# Patient Record
Sex: Male | Born: 1950 | Race: Asian | Hispanic: No | Marital: Married | Smoking: Former smoker
Health system: Southern US, Community
[De-identification: ages and names within clinical notes are randomized; demographics above are authoritative.]

## PROBLEM LIST (undated history)

## (undated) ENCOUNTER — Ambulatory Visit: Disposition: A | Discharge status: Home / Self Care | Payer: Self-pay

## (undated) DIAGNOSIS — I480 Paroxysmal atrial fibrillation: Secondary | ICD-10-CM

## (undated) DIAGNOSIS — J45909 Unspecified asthma, uncomplicated: Secondary | ICD-10-CM

## (undated) DIAGNOSIS — J449 Chronic obstructive pulmonary disease, unspecified: Secondary | ICD-10-CM

## (undated) HISTORY — PX: NO PAST SURGERIES: SHX2092

## (undated) HISTORY — DX: Paroxysmal atrial fibrillation: I48.0

---

## 2012-10-20 ENCOUNTER — Ambulatory Visit (INDEPENDENT_AMBULATORY_CARE_PROVIDER_SITE_OTHER): Payer: PRIVATE HEALTH INSURANCE | Admitting: Emergency Medicine

## 2012-10-20 VITALS — BP 149/88 | HR 68 | Temp 98.4°F | Resp 16 | Ht 69.5 in | Wt 158.0 lb

## 2012-10-20 DIAGNOSIS — L02519 Cutaneous abscess of unspecified hand: Secondary | ICD-10-CM

## 2012-10-20 DIAGNOSIS — L03119 Cellulitis of unspecified part of limb: Secondary | ICD-10-CM

## 2012-10-20 MED ORDER — SULFAMETHOXAZOLE-TRIMETHOPRIM 800-160 MG PO TABS: 1.0000 | ORAL_TABLET | Freq: Two times a day (BID) | ORAL | Status: DC

## 2012-10-20 NOTE — Patient Instructions (Signed)
Cellulitis Cellulitis is an infection of the skin and the tissue beneath it. The infected area is usually red and tender. Cellulitis occurs most often in the arms and lower legs.   CAUSES   Cellulitis is caused by bacteria that enter the skin through cracks or cuts in the skin. The most common types of bacteria that cause cellulitis are Staphylococcus and Streptococcus. SYMPTOMS    Redness and warmth.   Swelling.   Tenderness or pain.   Fever.  DIAGNOSIS  Your caregiver can usually determine what is wrong based on a physical exam. Blood tests may also be done. TREATMENT   Treatment usually involves taking an antibiotic medicine. HOME CARE INSTRUCTIONS    Take your antibiotics as directed. Finish them even if you start to feel better.   Keep the infected arm or leg elevated to reduce swelling.   Apply a warm cloth to the affected area up to 4 times per day to relieve pain.   Only take over-the-counter or prescription medicines for pain, discomfort, or fever as directed by your caregiver.   Keep all follow-up appointments as directed by your caregiver.  SEEK MEDICAL CARE IF:    You notice red streaks coming from the infected area.   Your red area gets larger or turns dark in color.   Your bone or joint underneath the infected area becomes painful after the skin has healed.   Your infection returns in the same area or another area.   You notice a swollen bump in the infected area.   You develop new symptoms.  SEEK IMMEDIATE MEDICAL CARE IF:    You have a fever.   You feel very sleepy.   You develop vomiting or diarrhea.   You have a general ill feeling (malaise) with muscle aches and pains.  MAKE SURE YOU:    Understand these instructions.   Will watch your condition.   Will get help right away if you are not doing well or get worse.  Document Released: 05/26/2005 Document Revised: 02/15/2012 Document Reviewed: 11/01/2011 [employer] Patient Information 2013  [employer]    

## 2012-10-20 NOTE — Progress Notes (Signed)
[facility] [address] [place] [address] [phone]  Date:  10/20/2012   Name:  Ronnie Palmer   DOB:  25-Mar-1951   MRN:  [id number]  PCP:  No primary provider on file.    Chief Complaint: Hand Injury   History of Present Illness:  Ronnie Palmer is a 62 y.o. very pleasant male patient who presents with the following:  No history of injury.  Has sudden swelling and pain in dorsum of right hand over the third MCP joint.  No fever or chills.  No history of gout.  There is no problem list on file for this patient.   History reviewed. No pertinent past medical history.  History reviewed. No pertinent past surgical history.  History  Substance Use Topics  . Smoking status: Current Every Day Smoker -- 1.00 packs/day  . Smokeless tobacco: Not on file  . Alcohol Use: No    History reviewed. No pertinent family history.  No Known Allergies  Medication list has been reviewed and updated.  No current outpatient prescriptions on file prior to visit.   No current facility-administered medications on file prior to visit.    Review of Systems:  As per HPI, otherwise negative.    Physical Examination: Filed Vitals:   10/20/12 1438  BP: 149/88  Pulse: 68  Temp: 98.4 F (36.9 C)  Resp: 16   Filed Vitals:   10/20/12 1438  Height: 5' 9.5" (1.765 m)  Weight: 158 lb (71.668 kg)   Body mass index is 23.01 kg/(m^2). Ideal Body Weight: Weight in (lb) to have BMI = 25: 171.4   GEN: WDWN, NAD, Non-toxic, Alert & Oriented x 3 HEENT: Atraumatic, Normocephalic.  Ears and Nose: No external deformity. EXTR: No clubbing/cyanosis/edema NEURO: Normal gait.  PSYCH: Normally interactive. Conversant. Not depressed or anxious appearing.  Calm demeanor.  HAND:  Right hand swelling and erythema over right 3rd MCP joint and dorsum of hand.  No joint tenderness or crepitus.  No lymphangitis. NATI.  No foreign body.  Assessment and  Plan: Cellulitis hand Septra Local heat Follow up Monday if not improved  Carmelina Dane, MD

## 2014-10-18 ENCOUNTER — Encounter (HOSPITAL_COMMUNITY): Payer: Self-pay | Admitting: Emergency Medicine

## 2014-10-18 ENCOUNTER — Emergency Department (INDEPENDENT_AMBULATORY_CARE_PROVIDER_SITE_OTHER)
Admission: EM | Admit: 2014-10-18 | Discharge: 2014-10-18 | Disposition: A | Discharge status: Home / Self Care | Payer: PRIVATE HEALTH INSURANCE | Source: Home / Self Care | Attending: Family Medicine | Admitting: Family Medicine

## 2014-10-18 DIAGNOSIS — R21 Rash and other nonspecific skin eruption: Secondary | ICD-10-CM | POA: Diagnosis not present

## 2014-10-18 MED ORDER — CLOTRIMAZOLE-BETAMETHASONE 1-0.05 % EX CREA: TOPICAL_CREAM | CUTANEOUS | Status: DC

## 2014-10-18 NOTE — ED Notes (Signed)
Pt states that he has had ear pain and ear itching for awhile now per pt.

## 2014-10-18 NOTE — ED Provider Notes (Signed)
CSN: [id number]     Arrival date & time 10/18/14  1550 History   First MD Initiated Contact with Patient 10/18/14 1641     Chief Complaint  Patient presents with  . Otalgia   (Consider location/radiation/quality/duration/timing/severity/associated sxs/prior Treatment) HPI        64 year old male presents for evaluation of a rash. For the past few months he has a rash on the inside of both of his ears. He gets cracked and irritated. No discharge or drainage. No hearing difficulty. No systemic symptoms.  No past medical history on file. No past surgical history on file. No family history on file. History  Substance Use Topics  . Smoking status: Current Every Day Smoker -- 1.00 packs/day  . Smokeless tobacco: Not on file  . Alcohol Use: No    Review of Systems  Skin: Positive for rash.  All other systems reviewed and are negative.   Allergies  Review of patient's allergies indicates no known allergies.  Home Medications   Prior to Admission medications   Medication Sig Start Date End Date Taking? Authorizing Provider  clotrimazole-betamethasone (LOTRISONE) cream Apply to affected area 3 times daily for 2 weeks 10/18/14   Graylon GoodZachary H Jaquil Todt, PA-C  sulfamethoxazole-trimethoprim (BACTRIM DS,SEPTRA DS) 800-160 MG per tablet Take 1 tablet by mouth 2 (two) times daily. 10/20/12   Carmelina DaneJeffery S Anderson, MD   BP 145/86 mmHg  Pulse 84  Temp(Src) 98.2 F (36.8 C) (Oral)  Resp 16  SpO2 95% Physical Exam  Constitutional: He is oriented to person, place, and time. He appears well-developed and well-nourished. No distress.  HENT:  Head: Normocephalic and atraumatic.  Bilateral external ears at the superior portion of the external auditory canal is crusting, slightly macerated, slightly cracked skin  Pulmonary/Chest: Effort normal. No respiratory distress.  Neurological: He is alert and oriented to person, place, and time. Coordination normal.  Skin: Skin is warm and dry. No rash noted. He is  not diaphoretic.  Psychiatric: He has a normal mood and affect. Judgment normal.  Nursing note and vitals reviewed.   ED Course  Procedures (including critical care time) Labs Review Labs Reviewed - No data to display  Imaging Review No results found.   MDM   1. Rash    Treat with Lotrisone cream. Follow-up when necessary if not improving with Lotrisone  Meds ordered this encounter  Medications  . clotrimazole-betamethasone (LOTRISONE) cream    Sig: Apply to affected area 3 times daily for 2 weeks    Dispense:  45 g    Refill:  2    Order Specific Question:  Supervising Provider    Answer:  Bradd CanaryKINDL, JAMES D [[id number]]       Graylon GoodZachary H Darneshia Demary, PA-C 10/18/14 1657

## 2014-10-18 NOTE — Discharge Instructions (Signed)

## 2016-12-14 ENCOUNTER — Ambulatory Visit (HOSPITAL_COMMUNITY)
Admission: EM | Admit: 2016-12-14 | Discharge: 2016-12-14 | Disposition: A | Discharge status: Home / Self Care | Payer: Commercial Managed Care - PPO | Attending: Internal Medicine | Admitting: Internal Medicine

## 2016-12-14 ENCOUNTER — Ambulatory Visit (INDEPENDENT_AMBULATORY_CARE_PROVIDER_SITE_OTHER): Payer: Commercial Managed Care - PPO

## 2016-12-14 DIAGNOSIS — M7552 Bursitis of left shoulder: Secondary | ICD-10-CM

## 2016-12-14 MED ORDER — CLOTRIMAZOLE-BETAMETHASONE 1-0.05 % EX CREA: TOPICAL_CREAM | CUTANEOUS | 2 refills | Status: DC

## 2016-12-14 MED ORDER — CYCLOBENZAPRINE HCL 10 MG PO TABS: 10.0000 mg | ORAL_TABLET | Freq: Two times a day (BID) | ORAL | 0 refills | Status: DC | PRN

## 2016-12-14 MED ORDER — PREDNISONE 10 MG (21) PO TBPK: ORAL_TABLET | Freq: Every day | ORAL | 0 refills | Status: DC

## 2016-12-14 NOTE — ED Triage Notes (Addendum)
Pain started 1 1/2 months ago.  No known injury.  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  Patient is right handed.    Patient has a cream that he uses for a rash, son is asking for a refill-son says he was seen here for this last time he was here

## 2016-12-14 NOTE — Discharge Instructions (Signed)
You have an impingement in your left shoulder. I prescribed a long course of steroids, prednisone, take 6 tablets today, then decreased by one tablet every other day until finished. Also prescribed a muscle relaxer called Flexeril, take one tablet twice a day. This medicine may cause drowsiness so do not drive or drink alcohol while taking.  I have also refilled your Lotrisone cream as requested.  Should your symptoms persist, or fail to resolve, follow up with an orthopedist or primary care provider.

## 2016-12-14 NOTE — ED Provider Notes (Signed)
CSN: [id number]     Arrival date & time 12/14/16  1413 History   First MD Initiated Contact with Patient 12/14/16 1531     Chief Complaint  Patient presents with  . Shoulder Pain   (Consider location/radiation/quality/duration/timing/severity/associated sxs/prior Treatment) 66 year old male presents to clinic with left shoulder pain. 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   The history is provided by the patient.  Shoulder Pain  Location:  Shoulder Shoulder location:  L shoulder Injury: no   Pain details:    Quality:  Aching, pressure and throbbing   Radiates to:  Does not radiate   Severity:  Moderate   Onset quality:  Gradual   Duration:  1 week   Timing:  Constant   Progression:  Worsening Handedness:  Right-handed Dislocation: no   Prior injury to area:  No Relieved by:  None tried Worsened by:  Movement and stretching area Ineffective treatments:  None tried Associated symptoms: decreased range of motion, stiffness and tingling   Associated symptoms: no back pain, no fatigue, no fever, no muscle weakness, no neck pain, no numbness and no swelling     No past medical history on file. No past surgical history on file. No family history on file. Social History  Substance Use Topics  . Smoking status: Current Every Day Smoker    Packs/day: 0.50  . Smokeless tobacco: Not on file  . Alcohol use Yes     Comment: occasional    Review of Systems  Constitutional: Negative for fatigue and fever.  HENT: Negative.   Respiratory: Negative.   Cardiovascular: Negative.   Gastrointestinal: Negative.   Genitourinary: Negative.   Musculoskeletal: Positive for stiffness. Negative for back pain and neck pain.  Neurological: Negative.     Allergies  Patient has no known allergies.  Home Medications   Prior to Admission medications   Medication Sig Start Date End Date Taking? Authorizing Provider  Naproxen Sodium (ALEVE PO)  Take by mouth.   Yes Historical Provider, MD  clotrimazole-betamethasone (LOTRISONE) cream Apply to affected area 3 times daily for 2 weeks 12/14/16   Dorena Bodo, NP  cyclobenzaprine (FLEXERIL) 10 MG tablet Take 1 tablet (10 mg total) by mouth 2 (two) times daily as needed for muscle spasms. 12/14/16   Dorena Bodo, NP  predniSONE (STERAPRED UNI-PAK 21 TAB) 10 MG (21) TBPK tablet Take by mouth daily. Take 6 tabs by mouth daily  for 2 days, then 5 tabs for 2 days, then 4 tabs for 2 days, then 3 tabs for 2 days, 2 tabs for 2 days, then 1 tab by mouth daily for 2 days 12/14/16   Dorena Bodo, NP   Meds Ordered and Administered this Visit  Medications - No data to display  BP (!) 150/84 (BP Location: Left Arm) Comment: notified rn  Pulse 66   Temp 98.2 F (36.8 C) (Oral)   Resp 14   SpO2 98%  No data found.   Physical Exam  Constitutional: He is oriented to person, place, and time. He appears well-developed and well-nourished. No distress.  HENT:  Head: Normocephalic and atraumatic.  Right Ear: External ear normal.  Left Ear: External ear normal.  Eyes: Conjunctivae are normal.  Cardiovascular: Normal rate and regular rhythm.   Pulmonary/Chest: Effort normal and breath sounds normal.  Musculoskeletal:  Left: Palpable, hard, mass felt at the acromioclavicular joint. Tenderness with pressure at the glenohumeral joint and the coracoid process. Flexion limited to 10, extension limited  to 40, pain with internal and external rotation, pain with abduction.  Neurological: He is alert and oriented to person, place, and time.  Skin: Skin is warm and dry. Capillary refill takes less than 2 seconds. He is not diaphoretic.  Psychiatric: He has a normal mood and affect. His behavior is normal.  Nursing note and vitals reviewed.   Urgent Care Course     Procedures (including critical care time)  Labs Review Labs Reviewed - No data to display  Imaging Review Dg Shoulder  Left  Result Date: 12/14/2016 CLINICAL DATA:  66 y/o  M; hard nodule and shoulder pain. EXAM: LEFT SHOULDER - 2+ VIEW COMPARISON:  None. FINDINGS: There is no evidence of fracture or dislocation. There is no evidence of arthropathy or other focal bone abnormality. Soft tissues are unremarkable. IMPRESSION: Negative. Electronically Signed   By: Mitzi Hansen M.D.   On: 12/14/2016 15:53      MDM   1. Bursitis of left shoulder    Treating for bursitis. Given long course of prednisone and flexeril. Recommend following up with ortho if symptoms persist past two weeks.     Dorena Bodo, NP 12/14/16 1704

## 2017-03-11 ENCOUNTER — Encounter (HOSPITAL_COMMUNITY): Payer: Self-pay | Admitting: *Deleted

## 2017-03-11 ENCOUNTER — Ambulatory Visit (HOSPITAL_COMMUNITY)
Admission: EM | Admit: 2017-03-11 | Discharge: 2017-03-11 | Disposition: A | Discharge status: Home / Self Care | Payer: Commercial Managed Care - PPO | Attending: Internal Medicine | Admitting: Internal Medicine

## 2017-03-11 DIAGNOSIS — R21 Rash and other nonspecific skin eruption: Secondary | ICD-10-CM

## 2017-03-11 DIAGNOSIS — M25551 Pain in right hip: Secondary | ICD-10-CM

## 2017-03-11 DIAGNOSIS — M5431 Sciatica, right side: Secondary | ICD-10-CM | POA: Diagnosis not present

## 2017-03-11 MED ORDER — CYCLOBENZAPRINE HCL 10 MG PO TABS: 10.0000 mg | ORAL_TABLET | Freq: Every day | ORAL | 0 refills | Status: DC

## 2017-03-11 MED ORDER — METHYLPREDNISOLONE SODIUM SUCC 125 MG IJ SOLR
125.0000 mg | Freq: Once | INTRAMUSCULAR | Status: AC
  Administered 2017-03-11: 125 mg via INTRAMUSCULAR

## 2017-03-11 MED ORDER — METHYLPREDNISOLONE SODIUM SUCC 125 MG IJ SOLR
INTRAMUSCULAR | Status: AC
  Filled 2017-03-11: qty 2

## 2017-03-11 MED ORDER — DICLOFENAC SODIUM 75 MG PO TBEC: 75.0000 mg | DELAYED_RELEASE_TABLET | Freq: Two times a day (BID) | ORAL | 0 refills | Status: DC | PRN

## 2017-03-11 NOTE — ED Triage Notes (Signed)
Pt  Also  Reports  Dryness  And  Rash  On  Bottom  Of  r  Foot

## 2017-03-11 NOTE — ED Triage Notes (Signed)
Pt  Reports  Pain   r  Hip    Pain  Radiates      Down  r  Leg     Pt  Reports      Had  Similar  Symptoms   On the  l  Shoulder       sev  Weeks  Ago  According to family  Member  The pt  Denies  Any  specefic  Injury  He  Reports  Pain on   Weight  Bearing     He  Reports  He  Has  Been using otc  meds  And  Cream  Topically

## 2017-03-11 NOTE — ED Provider Notes (Signed)
CSN: [id number]     Arrival date & time 03/11/17  1336 History   First MD Initiated Contact with Patient 03/11/17 1541     Chief Complaint  Patient presents with  . Hip Pain   (Consider location/radiation/quality/duration/timing/severity/associated sxs/prior Treatment) 66 year old male presents with right sided hip/upper buttock pain that radiates down his entire leg to his foot. Started about 4 days ago. No distinct trauma or injury. Worse at night. Also noticed rash on his right top of his foot 2 days ago- does not itch but slightly tender. Denies any fever, abdominal pain, nausea, vomiting, diarrhea, constipation, or urinary symptoms. He was seen here a few months ago for bursitis on his left shoulder which resolved with Prednisone taper. Otherwise, no chronic health issues. Does smoke tobacco daily. Takes no daily medication other than Lotrisone for occasional rash/irritation of external ear.     The history is provided by the patient and a caregiver.    History reviewed. No pertinent past medical history. History reviewed. No pertinent surgical history. History reviewed. No pertinent family history. Social History  Substance Use Topics  . Smoking status: Current Every Day Smoker    Packs/day: 0.50  . Smokeless tobacco: Not on file  . Alcohol use Yes     Comment: occasional    Review of Systems  Constitutional: Negative for activity change, appetite change, chills, diaphoresis, fatigue, fever and unexpected weight change.  Respiratory: Negative for cough, chest tightness, shortness of breath and wheezing.   Cardiovascular: Negative for chest pain, palpitations and leg swelling.  Gastrointestinal: Negative for abdominal pain, blood in stool, constipation, diarrhea, nausea and vomiting.  Genitourinary: Negative for decreased urine volume, difficulty urinating, dysuria, flank pain and hematuria.  Musculoskeletal: Positive for arthralgias, back pain and myalgias. Negative for gait  problem, joint swelling, neck pain and neck stiffness.  Skin: Positive for rash. Negative for wound.  Allergic/Immunologic: Negative for immunocompromised state.  Neurological: Negative for dizziness, tremors, seizures, syncope, weakness, light-headedness, numbness and headaches.  Hematological: Negative for adenopathy. Does not bruise/bleed easily.    Allergies  Patient has no known allergies.  Home Medications   Prior to Admission medications   Medication Sig Start Date End Date Taking? Authorizing Provider  clotrimazole-betamethasone (LOTRISONE) cream Apply to affected area 3 times daily for 2 weeks 12/14/16   Dorena Bodo, NP  cyclobenzaprine (FLEXERIL) 10 MG tablet Take 1 tablet (10 mg total) by mouth at bedtime. As needed for muscle spasms/pain. 03/11/17   Sudie Grumbling, NP  diclofenac (VOLTAREN) 75 MG EC tablet Take 1 tablet (75 mg total) by mouth 2 (two) times daily as needed for moderate pain. 03/11/17   Sudie Grumbling, NP   Meds Ordered and Administered this Visit   Medications  methylPREDNISolone sodium succinate (SOLU-MEDROL) 125 mg/2 mL injection 125 mg (125 mg Intramuscular Given 03/11/17 1606)    BP 130/74 (BP Location: Right Arm)   Pulse 78   Temp 98.6 F (37 C) (Oral)   Resp 18   SpO2 100%  No data found.   Physical Exam  Constitutional: He is oriented to person, place, and time. He appears well-developed and well-nourished. He is cooperative. No distress.  HENT:  Head: Normocephalic and atraumatic.  Right Ear: External ear normal.  Left Ear: External ear normal.  Nose: Nose normal.  Eyes: Pupils are equal, round, and reactive to light. Conjunctivae and EOM are normal.  Neck: Normal range of motion. Neck supple.  Cardiovascular: Normal rate, regular rhythm and normal  heart sounds.   No murmur heard. Pulmonary/Chest: Effort normal and breath sounds normal. No respiratory distress. He has no wheezes.  Abdominal: Soft. Normal appearance and bowel  sounds are normal. He exhibits no distension and no mass. There is no tenderness. There is no guarding and no CVA tenderness.  Musculoskeletal: Normal range of motion. He exhibits tenderness.       Right hip: He exhibits tenderness. He exhibits normal range of motion, normal strength, no swelling, no crepitus, no deformity and no laceration.       Lumbar back: He exhibits tenderness and pain. He exhibits normal range of motion, no swelling, no edema, no deformity and no spasm.       Legs:      Feet:  Tender on right side lower lumbar area/upper buttock with slight tenderness above right hip. SLR= + on right side. No pain with flexion of right hip joint. No distinct muscle spasm detected. Good distal pulses. No neuro deficits noted.    Lymphadenopathy:    He has no cervical adenopathy.  Neurological: He is alert and oriented to person, place, and time. He has normal strength and normal reflexes. No sensory deficit. Coordination and gait normal.  Skin: Skin is warm, dry and intact. Capillary refill takes less than 2 seconds. Rash noted. Rash is papular.  Pink papular lesions present on right foot only. Mainly along medial aspect of arch and dorsal aspect of foot. No lesions on the sole. No surrounding erythema. Slightly tender but no fluid or ulcers. No crusting or discharge.   Psychiatric: He has a normal mood and affect. His behavior is normal.    Urgent Care Course     Procedures (including critical care time)  Labs Review Labs Reviewed - No data to display  Imaging Review No results found.   Visual Acuity Review  Right Eye Distance:   Left Eye Distance:   Bilateral Distance:    Right Eye Near:   Left Eye Near:    Bilateral Near:         MDM   1. Sciatica of right side   2. Rash and nonspecific skin eruption    Discussed with patient and caregiver that he appears to have sciatica on the right side. Also reviewed that I am uncertain the cause of his rash on his foot-  appears like a contact dermatitis but does not itch and is tender. Does not appear to be shingles. Does not appear fungal. Will trial Solu-Medrol 125mg  given IM today to help with back pain as well as rash. May start Voltaren 75mg  twice a day as needed for pain. May also take Flexeril 10mg  at bedtime as needed. Note written for work. Continue to monitor pain and symptoms. If symptoms do not improve within 3 to 4 days, call the Orthopedic (information provided) for further evaluation.      Sudie GrumblingAmyot, Gurjit Loconte Berry, NP 03/11/17 2141

## 2017-03-11 NOTE — Discharge Instructions (Addendum)
You were given a steroid shot today to help with hip pain and to help with your rash. Recommend start Voltaren 75mg  twice a day as needed for pain. May also take Flexeril 10mg  muscle relaxer 1 tablet at bedtime as needed. Continue to monitor pain and symptoms. If symptoms do not improve within 3 to 4 days, call the Orthopedic for further evaluation.

## 2017-04-22 ENCOUNTER — Ambulatory Visit (HOSPITAL_COMMUNITY)
Admission: EM | Admit: 2017-04-22 | Discharge: 2017-04-22 | Disposition: A | Discharge status: Home / Self Care | Payer: Commercial Managed Care - PPO | Attending: Family | Admitting: Family

## 2017-04-22 ENCOUNTER — Encounter (HOSPITAL_COMMUNITY): Payer: Self-pay | Admitting: *Deleted

## 2017-04-22 DIAGNOSIS — L0232 Furuncle of buttock: Secondary | ICD-10-CM | POA: Insufficient documentation

## 2017-04-22 DIAGNOSIS — M549 Dorsalgia, unspecified: Secondary | ICD-10-CM | POA: Diagnosis present

## 2017-04-22 DIAGNOSIS — F17210 Nicotine dependence, cigarettes, uncomplicated: Secondary | ICD-10-CM | POA: Diagnosis not present

## 2017-04-22 DIAGNOSIS — Z79899 Other long term (current) drug therapy: Secondary | ICD-10-CM | POA: Insufficient documentation

## 2017-04-22 MED ORDER — DOXYCYCLINE HYCLATE 100 MG PO TBEC: 100.0000 mg | DELAYED_RELEASE_TABLET | Freq: Two times a day (BID) | ORAL | 0 refills | Status: DC

## 2017-04-22 NOTE — Discharge Instructions (Signed)
Abscess proximal to your anus. Please start oral antibiotics immediately. As discussed, please leave wick in place and come back for wound recheck in 2 days to ensure that it is improving and antibiotics are working. Pending wound culture. If any worsening symptoms including tenderness, swelling, fever, nausea or generally not feeling well, please present to emergency room.

## 2017-04-22 NOTE — ED Provider Notes (Signed)
[facility]    CSN: [id number] Arrival date & time: 04/22/17  1350     History   Chief Complaint Chief Complaint  Patient presents with  . Back Pain    HPI Ronnie Palmer is a 66 y.o. male.   CC: Pain and swelling around rectum. He has seen bright red blood. 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 Cannot sit on bottom due to pain.   NO back pain, abdominal pain, fever, N, V, constipation or straining.    No PCP.      No h/o ckd.  History reviewed. No pertinent past medical history.  There are no active problems to display for this patient.   History reviewed. No pertinent surgical history.     Home Medications    Prior to Admission medications   Medication Sig Start Date End Date Taking? Authorizing Provider  clotrimazole-betamethasone (LOTRISONE) cream Apply to affected area 3 times daily for 2 weeks 12/14/16   Dorena Bodo, NP  cyclobenzaprine (FLEXERIL) 10 MG tablet Take 1 tablet (10 mg total) by mouth at bedtime. As needed for muscle spasms/pain. 03/11/17   Sudie Grumbling, NP  diclofenac (VOLTAREN) 75 MG EC tablet Take 1 tablet (75 mg total) by mouth 2 (two) times daily as needed for moderate pain. 03/11/17   Sudie Grumbling, NP  doxycycline (DORYX) 100 MG EC tablet Take 1 tablet (100 mg total) by mouth 2 (two) times daily. 04/22/17   Allegra Grana, FNP    Family History History reviewed. No pertinent family history.  Social History Social History  Substance Use Topics  . Smoking status: Current Every Day Smoker    Packs/day: 0.50  . Smokeless tobacco: Never Used  . Alcohol use Yes     Comment: occasional     Allergies   Patient has no known allergies.   Review of Systems Review of Systems   Physical Exam Triage Vital Signs ED Triage Vitals  Enc Vitals Group     BP 04/22/17 1439 (!) 155/95     Pulse Rate 04/22/17 1439 69     Resp 04/22/17 1439 16     Temp 04/22/17 1439 97.9 F  (36.6 C)     Temp src --      SpO2 04/22/17 1439 100 %     Weight --      Height --      Head Circumference --      Peak Flow --      Pain Score 04/22/17 1438 10     Pain Loc --      Pain Edu? --      Excl. in GC? --    No data found.   Updated Vital Signs BP (!) 155/95 (BP Location: Right Arm)   Pulse 69   Temp 97.9 F (36.6 C)   Resp 16   SpO2 100%   Visual Acuity Right Eye Distance:   Left Eye Distance:   Bilateral Distance:    Right Eye Near:   Left Eye Near:    Bilateral Near:     Physical Exam  Constitutional: He appears well-developed and well-nourished.  Cardiovascular: Regular rhythm and normal heart sounds.   Pulmonary/Chest: Effort normal and breath sounds normal. No respiratory distress. He has no wheezes. He has no rhonchi. He has no rales.  Abdominal: Soft. Normal appearance and bowel sounds are normal. He exhibits no distension, no fluid wave and no mass. There is no tenderness.  There is no rigidity, no rebound and no guarding.  Genitourinary: Rectal exam shows no external hemorrhoid and no internal hemorrhoid.     Genitourinary Comments: Fluctuant abscess approximately 3 cm in diameter proximal to anus on diagram as noted on diagram. No hemorrhoids visualized.  No surrounding erythema, streaking.   No swelling, abscess, cyst noted over midline skin pits or pilonidal sinus opening.  Lymphadenopathy:       Head (left side): No submandibular and no preauricular adenopathy present.  Neurological: He is alert.  Skin: Skin is warm and dry.  Psychiatric: He has a normal mood and affect. His speech is normal and behavior is normal.  Vitals reviewed.   Patient does not have previous history of MRSA.  Patient does not have diabetes.   The patient gave informed consent for the procedure. Patient and I discussed risks, benefits, and alternatives to I & D.  Including risk of infection from laceration, localized pain, and bleeding. We discussed the option  for oral antibiotics. Patient verbalized understanding to conversation and all questions were answered. We jointly decided to proceed with procedure.  On exam, there is a 3 cm indurated abscess with visually purulent discharge under skin.  It is tender to palpation.  There is erythema with no discharge.  The patient gave informed consent for the procedure. The area was prepped with antiseptic solution. The area was anesthetized using lidocaine with epi. The patient tolerated the anesthetic well.    #11 blade was used to incise the abscess.  Abundant purulent bloody drainage was noted.  Bleeding from the wound was controlled by applying direct pressure with gauze.   Iodoform was used to pack the abscess.  A bandage was placed.  Wound culture was obtained.  The patient tolerated the procedure well.  Extensive instructions were given to the patient.   Complications: None  Take antibiotic as directed.  Follow-up for wound check in 2-3 days.   UC Treatments / Results  Labs (all labs ordered are listed, but only abnormal results are displayed) Labs Reviewed  AEROBIC CULTURE (SUPERFICIAL SPECIMEN)    EKG  EKG Interpretation None       Radiology No results found.  Procedures Procedures (including critical care time)  Medications Ordered in UC Medications - No data to display   Initial Impression / Assessment and Plan / UC Course  I have reviewed the triage vital signs and the nursing notes.  Pertinent labs & imaging results that were available during my care of the patient were reviewed by me and considered in my medical decision making (see chart for details).       Final Clinical Impressions(s) / UC Diagnoses   Final diagnoses:  Boil of buttock  Presentation supports infected fluctuant abscess. Based on location, , it is not over the pilonidal sinus opening. Patient tolerated incision and drainage well. We'll start doxycycline today. We'll recheck in 2 days.  Return precautions given to patient  New Prescriptions New Prescriptions   DOXYCYCLINE (DORYX) 100 MG EC TABLET    Take 1 tablet (100 mg total) by mouth 2 (two) times daily.     Controlled Substance Prescriptions [place] Controlled Substance Registry consulted? Not Applicable   Allegra Grana, FNP 04/22/17 1615

## 2017-04-22 NOTE — ED Triage Notes (Signed)
Patient reports discomfort around rectum.

## 2017-04-24 ENCOUNTER — Ambulatory Visit (HOSPITAL_COMMUNITY)
Admission: EM | Admit: 2017-04-24 | Discharge: 2017-04-24 | Disposition: A | Discharge status: Home / Self Care | Payer: Commercial Managed Care - PPO

## 2017-04-24 ENCOUNTER — Encounter (HOSPITAL_COMMUNITY): Payer: Self-pay | Admitting: Emergency Medicine

## 2017-04-24 DIAGNOSIS — L0232 Furuncle of buttock: Secondary | ICD-10-CM

## 2017-04-24 DIAGNOSIS — Z5189 Encounter for other specified aftercare: Secondary | ICD-10-CM

## 2017-04-24 LAB — AEROBIC CULTURE W GRAM STAIN (SUPERFICIAL SPECIMEN)

## 2017-04-24 LAB — AEROBIC CULTURE  (SUPERFICIAL SPECIMEN)

## 2017-04-24 NOTE — Discharge Instructions (Signed)
Your wound is healing well. Does not need any packing material in the wound. Keep covered for next 24 hours, then may take off gauze and shower with soap and water. Continue to monitor area for any redness, increased pain or swelling- if these occur, follow-up for recheck. Otherwise continue Doxycycline twice a day as directed. Schedule an appointment with a PCP as soon as possible to establish care with a PCP.

## 2017-04-24 NOTE — ED Triage Notes (Signed)
The patient presented to the [facility] for a follow up on a rectal abscess that was drained 04/22/2017.

## 2017-04-25 NOTE — ED Provider Notes (Signed)
[facility]    CSN: [id number] Arrival date & time: 04/24/17  1204     History   Chief Complaint Chief Complaint  Patient presents with  . Abscess    HPI Ronnie Palmer is a 66 y.o. male.   66 year old male presents for recheck on rectal abscess that was I & D here 2 days ago. Was packed with Iodoform and started on Doxycycline twice a day. Area is healing well and packing fell out last night. No side effects from antibiotic use. No fever and mild pain. Son interpreting for patient and also requests name of a PCP to establish care for patient.    The history is provided by the patient and a relative. The history is limited by a language barrier. A language interpreter was used (son).    History reviewed. No pertinent past medical history.  There are no active problems to display for this patient.   History reviewed. No pertinent surgical history.     Home Medications    Prior to Admission medications   Medication Sig Start Date End Date Taking? Authorizing Provider  clotrimazole-betamethasone (LOTRISONE) cream Apply to affected area 3 times daily for 2 weeks 12/14/16   Dorena Bodo, NP  doxycycline (DORYX) 100 MG EC tablet Take 1 tablet (100 mg total) by mouth 2 (two) times daily. 04/22/17   Allegra Grana, FNP    Family History History reviewed. No pertinent family history.  Social History Social History  Substance Use Topics  . Smoking status: Current Every Day Smoker    Packs/day: 0.50  . Smokeless tobacco: Never Used  . Alcohol use Yes     Comment: occasional     Allergies   Patient has no known allergies.   Review of Systems Review of Systems  Constitutional: Negative for activity change, appetite change, chills, fatigue and fever.  Gastrointestinal: Positive for rectal pain. Negative for abdominal pain, anal bleeding, blood in stool, diarrhea, nausea and vomiting.  Genitourinary: Negative for decreased urine volume,  difficulty urinating and dysuria.  Skin: Positive for wound.  Neurological: Negative for weakness, light-headedness, numbness and headaches.     Physical Exam Triage Vital Signs ED Triage Vitals [04/24/17 1229]  Enc Vitals Group     BP 140/78     Pulse Rate 62     Resp 16     Temp 98.3 F (36.8 C)     Temp Source Oral     SpO2 97 %     Weight      Height      Head Circumference      Peak Flow      Pain Score      Pain Loc      Pain Edu?      Excl. in GC?    No data found.   Updated Vital Signs BP 140/78 (BP Location: Right Arm)   Pulse 62   Temp 98.3 F (36.8 C) (Oral)   Resp 16   SpO2 97%   Visual Acuity Right Eye Distance:   Left Eye Distance:   Bilateral Distance:    Right Eye Near:   Left Eye Near:    Bilateral Near:     Physical Exam  Constitutional: He is oriented to person, place, and time. He appears well-developed and well-nourished. No distress.  HENT:  Head: Normocephalic and atraumatic.  Genitourinary: Rectal exam shows tenderness.     Genitourinary Comments: Abscess about 1cm on right distal aspect of anus.  Still open but minimal drainage. Mild tenderness. No surrounding erythema. Healing well.   Musculoskeletal: Normal range of motion.  Neurological: He is alert and oriented to person, place, and time.  Skin: Skin is warm and dry.  Psychiatric: He has a normal mood and affect. His behavior is normal.     UC Treatments / Results  Labs (all labs ordered are listed, but only abnormal results are displayed) Labs Reviewed - No data to display  EKG  EKG Interpretation None       Radiology No results found.  Procedures Procedures (including critical care time)  Medications Ordered in UC Medications - No data to display   Initial Impression / Assessment and Plan / UC Course  I have reviewed the triage vital signs and the nursing notes.  Pertinent labs & imaging results that were available during my care of the patient were  reviewed by me and considered in my medical decision making (see chart for details).    Discussed with son and patient that area is healing well. No packing needed. Applied bacitracin and covered with gauze. May remove gauze tomorrow and shower as usual. Keep clean and dry. Continue Doxycycline 100mg  twice a day as directed. If any increased pain, swelling or redness occur, follow up for recheck. Otherwise schedule an appointment with a PCP (contact information listed) as soon as possible to establish care.   Final Clinical Impressions(s) / UC Diagnoses   Final diagnoses:  Wound check, abscess    New Prescriptions Discharge Medication List as of 04/24/2017  1:27 PM       Controlled Substance Prescriptions [place] Controlled Substance Registry consulted? Not Applicable   Sudie Grumbling, NP 04/25/17 [phone]

## 2017-04-27 ENCOUNTER — Telehealth (HOSPITAL_COMMUNITY): Payer: Self-pay | Admitting: Emergency Medicine

## 2017-04-27 MED ORDER — CEPHALEXIN 500 MG PO CAPS: 500.0000 mg | ORAL_CAPSULE | Freq: Two times a day (BID) | ORAL | 0 refills | Status: DC

## 2017-04-27 NOTE — Telephone Encounter (Signed)
-----   Message from Eustace Moore, MD sent at 04/24/2017  6:24 PM EDT ----- Clinical staff please let patient know that wound culture was positive for rare Group B Strep and Haemophilus germs.   If pain/swelling/drainage from abscess are decreasing, would not change antibiotics at this time. If pain/swelling/drainage are not improving, would send rx for cephalexin 500mg  bid x 7d #14 no refills, and patient should also finish rx for doxycycline.   Patient should recheck or go to ED for increasing pain/swelling/drainage at abscess site or new fever >100.5.  LM

## 2017-04-27 NOTE — Telephone Encounter (Signed)
Son returned my call... Was able to ID pt properly... Was w/pt during visit.  Reports feeling better and sx are subsiding but still having some pain.  Pt is taking/tolarating well meds given at visit.  Per his request... Called in keflex to [facility].)  Adv pt if sx are not getting better to return or to f/u w/PCP Notified pt that lab results can be obtained through MyChart Pt verb understanding.

## 2018-03-22 ENCOUNTER — Other Ambulatory Visit: Payer: Self-pay

## 2018-03-22 ENCOUNTER — Encounter (HOSPITAL_COMMUNITY): Payer: Self-pay | Admitting: Emergency Medicine

## 2018-03-22 ENCOUNTER — Ambulatory Visit (HOSPITAL_COMMUNITY)
Admission: EM | Admit: 2018-03-22 | Discharge: 2018-03-22 | Disposition: A | Discharge status: Home / Self Care | Payer: Commercial Managed Care - PPO | Attending: Family Medicine | Admitting: Family Medicine

## 2018-03-22 DIAGNOSIS — R21 Rash and other nonspecific skin eruption: Secondary | ICD-10-CM | POA: Diagnosis not present

## 2018-03-22 MED ORDER — TRIAMCINOLONE ACETONIDE 0.1 % EX CREA: 1.0000 "application " | TOPICAL_CREAM | Freq: Two times a day (BID) | CUTANEOUS | 0 refills | Status: DC

## 2018-03-22 MED ORDER — CLOTRIMAZOLE-BETAMETHASONE 1-0.05 % EX CREA: TOPICAL_CREAM | CUTANEOUS | 2 refills | Status: DC

## 2018-03-22 NOTE — Discharge Instructions (Addendum)
As discussed, symptoms could be due to using cotton swabs, and drying out the ear canal. Apply triamcinolone cream to affected area for itching. Keep area clean and dry. Avoid rubbing at the area, avoid cotton swab use. You can take over the counter antihistamine such as benadryl, zyrtec to help with itching. Monitor for spreading redness, increase warmth, swelling, increase pain, follow up for reevaluation needed.   As discussed, in the future, you can use over the counter hydrocortisone cream to see if it helps with symptoms. Lotrisone also has a fungal cream content, if you would like to cover for fungal infection, you can try over the counter lamisil as directed.

## 2018-03-22 NOTE — ED Provider Notes (Signed)
[facility]    CSN: [id number] Arrival date & time: 03/22/18  1503     History   Chief Complaint Chief Complaint  Patient presents with  . Rash    external ears bilateral    HPI Ronnie Palmer is a 67 y.o. male.   67 year old male comes in with son for recurrent itching and pain to the external ears. States in the past used lotrisone with resolution of symptoms. Currently out of lotrisone and son wonders if there is an otc medicine that they can buy. Patient does use cotton swab to the region, especially for itching, which then causes pain. Denies spreading redness, increased warmth, fever. Denies pain or itching, rash to other locations. Denies ear pain, ear canal swelling, decrease in hearing.      History reviewed. No pertinent past medical history.  There are no active problems to display for this patient.   History reviewed. No pertinent surgical history.     Home Medications    Prior to Admission medications   Medication Sig Start Date End Date Taking? Authorizing Provider  clotrimazole-betamethasone (LOTRISONE) cream Apply to affected area 3 times daily for 2 weeks 03/22/18   Cathie Hoops, Amy V, PA-C  triamcinolone cream (KENALOG) 0.1 % Apply 1 application topically 2 (two) times daily. 03/22/18   Belinda Fisher, PA-C    Family History History reviewed. No pertinent family history.  Social History Social History   Tobacco Use  . Smoking status: Current Every Day Smoker    Packs/day: 0.50  . Smokeless tobacco: Never Used  Substance Use Topics  . Alcohol use: Yes    Comment: occasional  . Drug use: No     Allergies   Patient has no known allergies.   Review of Systems Review of Systems  Reason unable to perform ROS: See HPI as above.     Physical Exam Triage Vital Signs ED Triage Vitals  Enc Vitals Group     BP 03/22/18 1516 (!) 161/80     Pulse Rate 03/22/18 1516 86     Resp 03/22/18 1516 16     Temp 03/22/18 1516 98 F (36.7 C)    Temp Source 03/22/18 1516 Oral     SpO2 03/22/18 1516 97 %     Weight --      Height --      Head Circumference --      Peak Flow --      Pain Score 03/22/18 1523 0     Pain Loc --      Pain Edu? --      Excl. in GC? --    No data found.  Updated Vital Signs BP (!) 161/80 (BP Location: Left Arm)   Pulse 86   Temp 98 F (36.7 C) (Oral)   Resp 16   SpO2 97%   Physical Exam  Constitutional: He is oriented to person, place, and time. He appears well-developed and well-nourished. No distress.  HENT:  Head: Normocephalic and atraumatic.  Dry cracking skin to the 12 oclock region of the external ear canal bilaterally. No bleeding. No erythema, increased warmth. No swelling of the ear canal. Without similar appearance within the ear canal. TM normal without erythema or bulging bilaterally.   Eyes: Pupils are equal, round, and reactive to light. Conjunctivae are normal.  Neurological: He is alert and oriented to person, place, and time.  Skin: He is not diaphoretic.    UC Treatments / Results  Labs (all  labs ordered are listed, but only abnormal results are displayed) Labs Reviewed - No data to display  EKG None  Radiology No results found.  Procedures Procedures (including critical care time)  Medications Ordered in UC Medications - No data to display  Initial Impression / Assessment and Plan / UC Course  I have reviewed the triage vital signs and the nursing notes.  Pertinent labs & imaging results that were available during my care of the patient were reviewed by me and considered in my medical decision making (see chart for details).    Discussed with patient and family member, symptoms could be caused by use of cotton swab. Will start triamcinolone cream as no indication of fungal infection on exam today. Rx of lotrisone given to patient, can fill if no relief on triamcinolone. Discussed over the counter medicine such as Lamisil with hydrocortisone cream to help  with symptoms. Will have patient avoid using cotton swab to see if it will prevent further flare up of symptoms. Return precautions given. Patient and family member expresses understanding and agrees to plan.  Final Clinical Impressions(s) / UC Diagnoses   Final diagnoses:  Rash    ED Prescriptions    Medication Sig Dispense Auth. Provider   triamcinolone cream (KENALOG) 0.1 % Apply 1 application topically 2 (two) times daily. 30 g Yu, Amy V, PA-C   clotrimazole-betamethasone (LOTRISONE) cream Apply to affected area 3 times daily for 2 weeks 45 g Threasa AlphaYu, Amy V, PA-C        Yu, Amy V, [place] 03/22/18 1558

## 2018-03-22 NOTE — ED Triage Notes (Signed)
Pt has a recurrent issue with pain/itching to his external ears.  He has been prescribed Lotrisone in the past and they state it works well.  He will get a flare up, use the cream and it will go away for a while.    Family is wanting to know if there is an equivalent OTC medication.

## 2018-11-25 ENCOUNTER — Emergency Department (HOSPITAL_COMMUNITY): Payer: Commercial Managed Care - PPO

## 2018-11-25 ENCOUNTER — Ambulatory Visit (HOSPITAL_COMMUNITY)
Admission: EM | Admit: 2018-11-25 | Discharge: 2018-11-25 | Disposition: A | Discharge status: Home / Self Care | Payer: Commercial Managed Care - PPO | Source: Home / Self Care | Attending: Family Medicine | Admitting: Family Medicine

## 2018-11-25 ENCOUNTER — Encounter (HOSPITAL_COMMUNITY): Payer: Self-pay

## 2018-11-25 ENCOUNTER — Other Ambulatory Visit: Payer: Self-pay

## 2018-11-25 ENCOUNTER — Emergency Department (HOSPITAL_COMMUNITY)
Admission: EM | Admit: 2018-11-25 | Discharge: 2018-11-25 | Disposition: A | Discharge status: Home / Self Care | Payer: Commercial Managed Care - PPO | Attending: Emergency Medicine | Admitting: Emergency Medicine

## 2018-11-25 DIAGNOSIS — R221 Localized swelling, mass and lump, neck: Secondary | ICD-10-CM | POA: Insufficient documentation

## 2018-11-25 DIAGNOSIS — F17210 Nicotine dependence, cigarettes, uncomplicated: Secondary | ICD-10-CM | POA: Insufficient documentation

## 2018-11-25 DIAGNOSIS — J029 Acute pharyngitis, unspecified: Secondary | ICD-10-CM | POA: Diagnosis present

## 2018-11-25 DIAGNOSIS — J36 Peritonsillar abscess: Secondary | ICD-10-CM | POA: Insufficient documentation

## 2018-11-25 DIAGNOSIS — Z79899 Other long term (current) drug therapy: Secondary | ICD-10-CM | POA: Insufficient documentation

## 2018-11-25 LAB — CBC WITH DIFFERENTIAL/PLATELET
Abs Immature Granulocytes: 0.09 10*3/uL — ABNORMAL HIGH (ref 0.00–0.07)
Basophils Absolute: 0.1 10*3/uL (ref 0.0–0.1)
Basophils Relative: 0 %
EOS PCT: 2 %
Eosinophils Absolute: 0.3 10*3/uL (ref 0.0–0.5)
HEMATOCRIT: 43.2 % (ref 39.0–52.0)
HEMOGLOBIN: 13.4 g/dL (ref 13.0–17.0)
IMMATURE GRANULOCYTES: 1 %
LYMPHS ABS: 2 10*3/uL (ref 0.7–4.0)
LYMPHS PCT: 12 %
MCH: 25.9 pg — ABNORMAL LOW (ref 26.0–34.0)
MCHC: 31 g/dL (ref 30.0–36.0)
MCV: 83.4 fL (ref 80.0–100.0)
Monocytes Absolute: 1.5 10*3/uL — ABNORMAL HIGH (ref 0.1–1.0)
Monocytes Relative: 9 %
Neutro Abs: 13.1 10*3/uL — ABNORMAL HIGH (ref 1.7–7.7)
Neutrophils Relative %: 76 %
Platelets: 268 10*3/uL (ref 150–400)
RBC: 5.18 MIL/uL (ref 4.22–5.81)
RDW: 12.7 % (ref 11.5–15.5)
WBC: 17.1 10*3/uL — ABNORMAL HIGH (ref 4.0–10.5)
nRBC: 0 % (ref 0.0–0.2)

## 2018-11-25 LAB — POCT RAPID STREP A: Streptococcus, Group A Screen (Direct): NEGATIVE

## 2018-11-25 LAB — BASIC METABOLIC PANEL
ANION GAP: 13 (ref 5–15)
BUN: 13 mg/dL (ref 8–23)
CHLORIDE: 100 mmol/L (ref 98–111)
CO2: 20 mmol/L — AB (ref 22–32)
Calcium: 8.7 mg/dL — ABNORMAL LOW (ref 8.9–10.3)
Creatinine, Ser: 0.83 mg/dL (ref 0.61–1.24)
GFR calc Af Amer: >60 mL/min (ref >60)
GFR calc non Af Amer: >60 mL/min (ref >60)
Glucose, Bld: 89 mg/dL (ref 70–99)
Potassium: 3.8 mmol/L (ref 3.5–5.1)
Sodium: 133 mmol/L — ABNORMAL LOW (ref 135–145)

## 2018-11-25 MED ORDER — IOHEXOL 300 MG/ML  SOLN
100.0000 mL | Freq: Once | INTRAMUSCULAR | Status: AC | PRN
  Administered 2018-11-25: 75 mL via INTRAVENOUS

## 2018-11-25 MED ORDER — IBUPROFEN 800 MG PO TABS: 800.0000 mg | ORAL_TABLET | Freq: Three times a day (TID) | ORAL | 0 refills | Status: AC | PRN

## 2018-11-25 MED ORDER — CLINDAMYCIN HCL 300 MG PO CAPS: 300.0000 mg | ORAL_CAPSULE | Freq: Four times a day (QID) | ORAL | 0 refills | Status: AC

## 2018-11-25 MED ORDER — CLINDAMYCIN PHOSPHATE 900 MG/50ML IV SOLN
900.0000 mg | Freq: Once | INTRAVENOUS | Status: AC
  Administered 2018-11-25: 900 mg via INTRAVENOUS
  Filled 2018-11-25: qty 50

## 2018-11-25 MED ORDER — DEXAMETHASONE SODIUM PHOSPHATE 10 MG/ML IJ SOLN
10.0000 mg | Freq: Once | INTRAMUSCULAR | Status: AC
  Administered 2018-11-25: 10 mg via INTRAVENOUS
  Filled 2018-11-25: qty 1

## 2018-11-25 MED ORDER — SODIUM CHLORIDE 0.9 % IV BOLUS
1000.0000 mL | Freq: Once | INTRAVENOUS | Status: AC
  Administered 2018-11-25: 1000 mL via INTRAVENOUS

## 2018-11-25 MED ORDER — LIDOCAINE-EPINEPHRINE 1 %-1:100000 IJ SOLN
10.0000 mL | Freq: Once | INTRAMUSCULAR | Status: DC
  Filled 2018-11-25: qty 10

## 2018-11-25 MED ORDER — DEXAMETHASONE SODIUM PHOSPHATE 10 MG/ML IJ SOLN
10.0000 mg | Freq: Once | INTRAMUSCULAR | Status: DC
  Filled 2018-11-25: qty 1

## 2018-11-25 NOTE — Discharge Instructions (Signed)
Recommending further evaluation and management in the ED for worsening soft palate swelling and redness, sore throat, and sensation of throat swelling x 1 week. Patient and son aware and in agreement with plan.  Will go by private vehicle to [facility]

## 2018-11-25 NOTE — Consult Note (Signed)
Reason for Consult: Left peritonsillar abscess Referring Physician: Virgina Norfolk, DO  HPI:  Ronnie Palmer is an 68 y.o. male who was referred to the [facility] ER for treatment of his left peritonsillar abscess. He presents with worsening sore throat x 1 week. Patient also states it feels like his throat is closing in.  Symptoms are made worse with swallowing, but tolerating liquids and own secretions.  Denies drooling, difficulty breathing, SOB, wheezing, chest pain.  His CT scan shows a 3cm left PTA.   History reviewed. No pertinent past medical history.  History reviewed. No pertinent surgical history.  History reviewed. No pertinent family history.  Social History:  reports that he has been smoking. He has been smoking about 0.50 packs per day. He has never used smokeless tobacco. He reports current alcohol use. He reports that he does not use drugs.  Allergies: No Known Allergies  Prior to Admission medications   Medication Sig Start Date End Date Taking? Authorizing Provider  clindamycin (CLEOCIN) 300 MG capsule Take 1 capsule (300 mg total) by mouth 4 (four) times daily for 10 days. 11/25/18 12/05/18  Virgina Norfolk, DO  clotrimazole-betamethasone (LOTRISONE) cream Apply to affected area 3 times daily for 2 weeks 03/22/18   Belinda Fisher, PA-C  ibuprofen (ADVIL,MOTRIN) 800 MG tablet Take 1 tablet (800 mg total) by mouth every 8 (eight) hours as needed for up to 30 days. 11/25/18 12/25/18  Curatolo, Adam, DO  triamcinolone cream (KENALOG) 0.1 % Apply 1 application topically 2 (two) times daily. 03/22/18   Belinda Fisher, PA-C       Results for orders placed or performed during the hospital encounter of 11/25/18 (from the past 48 hour(s))  CBC with Differential     Status: Abnormal   Collection Time: 11/25/18 11:51 AM  Result Value Ref Range   WBC 17.1 (H) 4.0 - 10.5 K/uL   RBC 5.18 4.22 - 5.81 MIL/uL   Hemoglobin 13.4 13.0 - 17.0 g/dL   HCT 96.2 83.6 - 62.9 %   MCV 83.4 80.0 - 100.0 fL    MCH 25.9 (L) 26.0 - 34.0 pg   MCHC 31.0 30.0 - 36.0 g/dL   RDW 47.6 54.6 - 50.3 %   Platelets 268 150 - 400 K/uL   nRBC 0.0 0.0 - 0.2 %   Neutrophils Relative % 76 %   Neutro Abs 13.1 (H) 1.7 - 7.7 K/uL   Lymphocytes Relative 12 %   Lymphs Abs 2.0 0.7 - 4.0 K/uL   Monocytes Relative 9 %   Monocytes Absolute 1.5 (H) 0.1 - 1.0 K/uL   Eosinophils Relative 2 %   Eosinophils Absolute 0.3 0.0 - 0.5 K/uL   Basophils Relative 0 %   Basophils Absolute 0.1 0.0 - 0.1 K/uL   Immature Granulocytes 1 %   Abs Immature Granulocytes 0.09 (H) 0.00 - 0.07 K/uL    Comment: Performed at [facility], [address], [place], [place] [address]  Basic metabolic panel     Status: Abnormal   Collection Time: 11/25/18 11:51 AM  Result Value Ref Range   Sodium 133 (L) 135 - 145 mmol/L   Potassium 3.8 3.5 - 5.1 mmol/L   Chloride 100 98 - 111 mmol/L   CO2 20 (L) 22 - 32 mmol/L   Glucose, Bld 89 70 - 99 mg/dL   BUN 13 8 - 23 mg/dL   Creatinine, Ser 8.12 0.61 - 1.24 mg/dL   Calcium 8.7 (L) 8.9 - 10.3 mg/dL  GFR calc non Af Amer >60 >60 mL/min   GFR calc Af Amer >60 >60 mL/min   Anion gap 13 5 - 15    Comment: Performed at [facility], [address], [place], [place] [address]    Ct Soft Tissue Neck W Contrast  Result Date: 11/25/2018 CLINICAL DATA:  Sore throat. EXAM: CT NECK WITH CONTRAST TECHNIQUE: Multidetector CT imaging of the neck was performed using the standard protocol following the bolus administration of intravenous contrast. CONTRAST:  75mL OMNIPAQUE IOHEXOL 300 MG/ML  SOLN COMPARISON:  None. FINDINGS: Pharynx and larynx: There is a 3 cm LEFT tonsillar abscess, with mild to moderate mass effect on the airway at the oropharyngeal level; patency of the airway remains. No peritonsillar inflammation is present. There is uvular enlargement, and BILATERAL nasopharyngeal fullness. Mild inflammation extends inferolaterally toward the LEFT carotid space, but there is no retropharyngeal  abscess. The RIGHT tonsil is mildly enlarged, but there is no similar abscess. Edema extends into the LEFT aryepiglottic fold. LEFT vocal cord paralysis is suspected, uncertain chronicity. Salivary glands: No inflammation, mass, or stone. Thyroid: Normal. Lymph nodes: Reactive cervical adenopathy, not unexpected. Vascular: Negative. Limited intracranial: Negative. Visualized orbits: Negative. Mastoids and visualized paranasal sinuses: Clear. Skeleton: No acute or aggressive process. Upper chest: Negative. Other: None. IMPRESSION: 1. 3 cm LEFT tonsillar abscess, with mass effect on the airway. 2. Edema or infection extends into the LEFT aryepiglottic fold. 3. LEFT vocal cord paralysis is suspected, uncertain chronicity. 4. Reactive cervical adenopathy. 5. These results were called by telephone at the time of interpretation on 11/25/2018 at 1:23 pm to Dr. Virgina Norfolk , who verbally acknowledged these results. Electronically Signed   By: Elsie Stain M.D.   On: 11/25/2018 13:28   Review of Systems  Constitutional: Negative for chills and fever.  HENT: Positive for sore throat, trouble swallowing and voice change. Negative for congestion, dental problem, drooling, ear pain and facial swelling.   Eyes: Negative for pain and visual disturbance.  Respiratory: Negative for cough and shortness of breath.   Cardiovascular: Negative for chest pain and palpitations.  Gastrointestinal: Negative for abdominal pain and vomiting.  Genitourinary: Negative for dysuria and hematuria.  Musculoskeletal: Negative for arthralgias and back pain.  Skin: Negative for color change and rash.  Neurological: Negative for seizures, syncope and headaches.  All other systems reviewed and are negative.  Blood pressure (!) 178/86, pulse 70, temperature 99 F (37.2 C), temperature source Oral, resp. rate 17, SpO2 96 %. Physical Exam Vitals signs and nursing note reviewed.  Constitutional:      General: He is not in acute  distress.    Appearance: He is well-developed. He is not ill-appearing.  HENT:     Head: Normocephalic and atraumatic.     Comments: No trismus, no drooling, muffled voice    Nose: No congestion.     Mouth: Mucous membranes are moist. No oral lesions.     Pharynx: Left peritonsillar abscess, with significant uvula deviation to the right. Eyes:     Conjunctiva/sclera: Conjunctivae normal.  Neck:     Musculoskeletal: Neck supple.     Comments: Decreased ROM of neck to the left Cardiovascular:     Rate and Rhythm: Normal rate and regular rhythm.  Pulmonary:     Effort: Pulmonary effort is normal. No respiratory distress.     Breath sounds: Normal breath sounds.  Skin:    General: Skin is warm and dry.  Neurological:  Mental Status: He is alert.   Procedure: Incision and drainage of the left peritonsillar abscess.   Anesthesia: local anesthesia with 1% lidocaine with epinephrine.   Description: The patient is placed upright on the hospital bed.  After adequate local anesthesia is achieved, an 18-G spinal needle is used to make multiple passes over the left superior tonsillar pole.  Approximately 3 cc of purulent fluid was evacuated.  The abscess cavity was incised opened with the spinal needle tip.  The patient tolerated the procedure well.    Assessment/Plan: Left peritonsillar abscess.  - I&D performed in the ER.  - D/C home on oral clindamycin 300mg  po QID for 10 days - Follow up with me as an outpatient on a prn basis.  Kashari Chalmers W Perseus Westall 11/25/2018, 4:08 PM

## 2018-11-25 NOTE — ED Provider Notes (Signed)
[facility]ARE CENTER   [id number] 11/25/18 Arrival Time: 1020  Abbreviated encounter  RC:VELF THROAT  SUBJECTIVE: History from: Family member used to obtained HPI  Ronnie Palmer is a 68 y.o. male who presents with worsening sore throat x 1 week. Patient also states it feels like his throat is closing in.  Symptoms are made worse with swallowing, but tolerating liquids and own secretions.  Denies drooling, difficulty breathing, SOB, wheezing, chest pain.    ROS: As per HPI.  History reviewed. No pertinent past medical history. History reviewed. No pertinent surgical history. No Known Allergies No current facility-administered medications on file prior to encounter.    Current Outpatient Medications on File Prior to Encounter  Medication Sig Dispense Refill  . clotrimazole-betamethasone (LOTRISONE) cream Apply to affected area 3 times daily for 2 weeks 45 g 2  . triamcinolone cream (KENALOG) 0.1 % Apply 1 application topically 2 (two) times daily. 30 g 0   Social History   Socioeconomic History  . Marital status: Married    Spouse name: Not on file  . Number of children: Not on file  . Years of education: Not on file  . Highest education level: Not on file  Occupational History  . Not on file  Social Needs  . Financial resource strain: Not on file  . Food insecurity:    Worry: Not on file    Inability: Not on file  . Transportation needs:    Medical: Not on file    Non-medical: Not on file  Tobacco Use  . Smoking status: Current Every Day Smoker    Packs/day: 0.50  . Smokeless tobacco: Never Used  Substance and Sexual Activity  . Alcohol use: Yes    Comment: occasional  . Drug use: No  . Sexual activity: Yes  Lifestyle  . Physical activity:    Days per week: Not on file    Minutes per session: Not on file  . Stress: Not on file  Relationships  . Social connections:    Talks on phone: Not on file    Gets together: Not on file    Attends religious  service: Not on file    Active member of club or organization: Not on file    Attends meetings of clubs or organizations: Not on file    Relationship status: Not on file  . Intimate partner violence:    Fear of current or ex partner: Not on file    Emotionally abused: Not on file    Physically abused: Not on file    Forced sexual activity: Not on file  Other Topics Concern  . Not on file  Social History Narrative  . Not on file   History reviewed. No pertinent family history.  OBJECTIVE:  Vitals:   11/25/18 1045  BP: (!) 146/83  Pulse: 66  Resp: 16  Temp: 98.2 F (36.8 C)  TempSrc: Oral  SpO2: 99%     General appearance: alert; NAD HEENT: NCAT; Throat: oropharynx not visualized due to soft palate swelling, soft palate erythematous, uvula deviated toward the RT, no drooling, tolerating secretions Neck: supple without LAD Lungs: CTA bilaterally without adventitious breath sounds; cough absent Heart: regular rate and rhythm.   Skin: warm and dry Psychological: alert and cooperative; normal mood and affect  LABS: Results for orders placed or performed during the hospital encounter of 11/25/18 (from the past 24 hour(s))  POCT rapid strep A [facility])     Status: None   Collection  Time: 11/25/18 11:00 AM  Result Value Ref Range   Streptococcus, Group A Screen (Direct) NEGATIVE NEGATIVE     ASSESSMENT & PLAN:  1. Throat swelling   2. Peritonsillar abscess    Strep negative Recommending further evaluation and management in the ED for worsening soft palate swelling and redness, sore throat, and sensation of throat swelling x 1 week. Patient and son aware and in agreement with plan.  Will go by private vehicle to [facility], [place], [place] 11/25/18 1154

## 2018-11-25 NOTE — ED Triage Notes (Signed)
Onset 1 1/2 week ago sore throat.  No respiratory or swallowing difficulties.

## 2018-11-25 NOTE — ED Triage Notes (Signed)
Pt present sore throat, symptoms started a week ago.  Pt states it feel like his throat is closing in and he has big lump in his throat.

## 2018-11-25 NOTE — ED Provider Notes (Signed)
[facility] EMERGENCY DEPARTMENT Provider Note   CSN: [id number] Arrival date & time: 11/25/18  1120    History   Chief Complaint Chief Complaint  Patient presents with   Sore Throat    HPI Ronnie Palmer is a 68 y.o. male.     Sent from urgent care for concern peritonsillar abscess, strep screen negative.   The history is provided by the patient.  Sore Throat  This is a new problem. The current episode started more than 1 week ago. The problem occurs constantly. The problem has been gradually worsening. Pertinent negatives include no chest pain, no abdominal pain, no headaches and no shortness of breath. Nothing aggravates the symptoms. Nothing relieves the symptoms. He has tried nothing for the symptoms. The treatment provided no relief.    History reviewed. No pertinent past medical history.  There are no active problems to display for this patient.   History reviewed. No pertinent surgical history.      Home Medications    Prior to Admission medications   Medication Sig Start Date End Date Taking? Authorizing Provider  clindamycin (CLEOCIN) 300 MG capsule Take 1 capsule (300 mg total) by mouth 4 (four) times daily for 10 days. 11/25/18 12/05/18  Virgina Norfolk, DO  clotrimazole-betamethasone (LOTRISONE) cream Apply to affected area 3 times daily for 2 weeks 03/22/18   Belinda Fisher, PA-C  ibuprofen (ADVIL,MOTRIN) 800 MG tablet Take 1 tablet (800 mg total) by mouth every 8 (eight) hours as needed for up to 30 days. 11/25/18 12/25/18  Manha Amato, DO  triamcinolone cream (KENALOG) 0.1 % Apply 1 application topically 2 (two) times daily. 03/22/18   Belinda Fisher, PA-C    Family History History reviewed. No pertinent family history.  Social History Social History   Tobacco Use   Smoking status: Current Every Day Smoker    Packs/day: 0.50   Smokeless tobacco: Never Used  Substance Use Topics   Alcohol use: Yes    Comment: occasional   Drug  use: No     Allergies   Patient has no known allergies.   Review of Systems Review of Systems  Constitutional: Negative for chills and fever.  HENT: Positive for sore throat, trouble swallowing and voice change. Negative for congestion, dental problem, drooling, ear pain and facial swelling.   Eyes: Negative for pain and visual disturbance.  Respiratory: Negative for cough and shortness of breath.   Cardiovascular: Negative for chest pain and palpitations.  Gastrointestinal: Negative for abdominal pain and vomiting.  Genitourinary: Negative for dysuria and hematuria.  Musculoskeletal: Negative for arthralgias and back pain.  Skin: Negative for color change and rash.  Neurological: Negative for seizures, syncope and headaches.  All other systems reviewed and are negative.    Physical Exam Updated Vital Signs  ED Triage Vitals [11/25/18 1130]  Enc Vitals Group     BP (!) 142/83     Pulse Rate 72     Resp 17     Temp 99 F (37.2 C)     Temp Source Oral     SpO2 95 %     Weight      Height      Head Circumference      Peak Flow      Pain Score      Pain Loc      Pain Edu?      Excl. in GC?     Physical Exam Vitals signs and nursing note reviewed.  Constitutional:      General: He is not in acute distress.    Appearance: He is well-developed. He is not ill-appearing.  HENT:     Head: Normocephalic and atraumatic.     Comments: No trismus, no drooling, muffled voice    Nose: No congestion.     Mouth/Throat:     Mouth: Mucous membranes are moist. No oral lesions.     Pharynx: Pharyngeal swelling (top palate inflammed and swollen), posterior oropharyngeal erythema and uvula swelling present. No oropharyngeal exudate.     Tonsils: Tonsillar abscess present. 1+ on the right. 3+ on the left.  Eyes:     Conjunctiva/sclera: Conjunctivae normal.  Neck:     Musculoskeletal: Neck supple.     Comments: Decreased ROM of neck to the left Cardiovascular:     Rate and  Rhythm: Normal rate and regular rhythm.     Heart sounds: No murmur.  Pulmonary:     Effort: Pulmonary effort is normal. No respiratory distress.     Breath sounds: Normal breath sounds.  Abdominal:     Palpations: Abdomen is soft.     Tenderness: There is no abdominal tenderness.  Lymphadenopathy:     Cervical: Cervical adenopathy present.  Skin:    General: Skin is warm and dry.     Capillary Refill: Capillary refill takes less than 2 seconds.  Neurological:     Mental Status: He is alert.      ED Treatments / Results  Labs (all labs ordered are listed, but only abnormal results are displayed) Labs Reviewed  CBC WITH DIFFERENTIAL/PLATELET - Abnormal; Notable for the following components:      Result Value   WBC 17.1 (*)    MCH 25.9 (*)    Neutro Abs 13.1 (*)    Monocytes Absolute 1.5 (*)    Abs Immature Granulocytes 0.09 (*)    All other components within normal limits  BASIC METABOLIC PANEL - Abnormal; Notable for the following components:   Sodium 133 (*)    CO2 20 (*)    Calcium 8.7 (*)    All other components within normal limits    EKG None  Radiology Ct Soft Tissue Neck W Contrast  Result Date: 11/25/2018 CLINICAL DATA:  Sore throat. EXAM: CT NECK WITH CONTRAST TECHNIQUE: Multidetector CT imaging of the neck was performed using the standard protocol following the bolus administration of intravenous contrast. CONTRAST:  75mL OMNIPAQUE IOHEXOL 300 MG/ML  SOLN COMPARISON:  None. FINDINGS: Pharynx and larynx: There is a 3 cm LEFT tonsillar abscess, with mild to moderate mass effect on the airway at the oropharyngeal level; patency of the airway remains. No peritonsillar inflammation is present. There is uvular enlargement, and BILATERAL nasopharyngeal fullness. Mild inflammation extends inferolaterally toward the LEFT carotid space, but there is no retropharyngeal abscess. The RIGHT tonsil is mildly enlarged, but there is no similar abscess. Edema extends into the LEFT  aryepiglottic fold. LEFT vocal cord paralysis is suspected, uncertain chronicity. Salivary glands: No inflammation, mass, or stone. Thyroid: Normal. Lymph nodes: Reactive cervical adenopathy, not unexpected. Vascular: Negative. Limited intracranial: Negative. Visualized orbits: Negative. Mastoids and visualized paranasal sinuses: Clear. Skeleton: No acute or aggressive process. Upper chest: Negative. Other: None. IMPRESSION: 1. 3 cm LEFT tonsillar abscess, with mass effect on the airway. 2. Edema or infection extends into the LEFT aryepiglottic fold. 3. LEFT vocal cord paralysis is suspected, uncertain chronicity. 4. Reactive cervical adenopathy. 5. These results were called by telephone at the time of  interpretation on 11/25/2018 at 1:23 pm to Dr. Virgina Norfolk , who verbally acknowledged these results. Electronically Signed   By: Elsie Stain M.D.   On: 11/25/2018 13:28    Procedures Procedures (including critical care time)  Medications Ordered in ED Medications  dexamethasone (DECADRON) injection 10 mg (has no administration in time range)  lidocaine-EPINEPHrine (XYLOCAINE W/EPI) 1 %-1:100000 (with pres) injection 10 mL (10 mLs Infiltration Not Given 11/25/18 1521)  clindamycin (CLEOCIN) IVPB 900 mg (0 mg Intravenous Stopped 11/25/18 1343)  dexamethasone (DECADRON) injection 10 mg (10 mg Intravenous Given 11/25/18 1154)  sodium chloride 0.9 % bolus 1,000 mL (0 mLs Intravenous Stopped 11/25/18 1450)  iohexol (OMNIPAQUE) 300 MG/ML solution 100 mL (75 mLs Intravenous Contrast Given 11/25/18 1257)     Initial Impression / Assessment and Plan / ED Course  I have reviewed the triage vital signs and the nursing notes.  Pertinent labs & imaging results that were available during my care of the patient were reviewed by me and considered in my medical decision making (see chart for details).     Ronnie Palmer is a 68 year old male with no significant medical history who presents the ED with sore  throat.  Patient with unremarkable vitals.  No fever.  Patient with swelling and pain in his throat for the last 7 to 10 days.  Was sent from urgent care for concern for tonsillar/peritonsillar abscess.  Patient has significant swelling to the left tonsillar area including the soft palate as well.  Patient has pain when he turns his head to the left.  No trismus, no drooling, no secretions.  Does have a muffled voice and has had a change in his voice.  Concerning for peritonsillar/tonsillar abscess.  We will get basic labs give IV Decadron, IV clindamycin, IV normal saline bolus.  Will obtain a CT scan to further evaluate.  Radiology called me on the phone to discuss findings.  Patient has 3 cm left tonsillar abscess with extension on the left side of the left aryeepiglottic fold.  He also suspect possible left vocal cord paralysis.  Patient does not have a history of any vocal cord paralysis.  Is able to talk but muffled.  He does have reactive cervical adenopathy as well.  Patient did have leukocytosis of 17 but otherwise unremarkable labs.  Will discuss case with ENT, Dr. Suszanne Conners who is on-call.  Dr. Suszanne Conners came to evaluate the patient.  He was able to drain the abscess.  Will give patient antibiotic prescription and patient will follow-up outpatient.  Recommend self quarantine at home until 72 hours after fever resolves as this could potentially be secondary to coronavirus.  Patient did not have any shortness of breath, cough, sputum production.  Ronnie Palmer was evaluated in Emergency Department on 11/25/2018 for the symptoms described in the history of present illness. He was evaluated in the context of the global COVID-19 pandemic, which necessitated consideration that the patient might be at risk for infection with the SARS-CoV-2 virus that causes COVID-19. Institutional protocols and algorithms that pertain to the evaluation of patients at risk for COVID-19 are in a state of rapid change based on  information released by regulatory bodies including the CDC and federal and state organizations. These policies and algorithms were followed during the patient's care in the ED.  This chart was dictated using voice recognition software.  Despite best efforts to proofread,  errors can occur which can change the documentation meaning.    Final Clinical Impressions(s) / ED  Diagnoses   Final diagnoses:  Tonsillar abscess    ED Discharge Orders         Ordered    clindamycin (CLEOCIN) 300 MG capsule  4 times daily     11/25/18 1524    ibuprofen (ADVIL,MOTRIN) 800 MG tablet  Every 8 hours PRN     11/25/18 1524           Sargon Scouten, DO 11/25/18 1525

## 2018-11-25 NOTE — Discharge Instructions (Signed)
There is a possibility ductus abscess with secondary to coronavirus.  Please do not return to work until you are fever free for over 72 hours.  Return to the ED if symptoms worsen.  Self quarantine as instructed below, go to the [facility] website for further instructions as well.  If you live with, or provide care at home for, a person confirmed to have, or being evaluated for, COVID-19 infection please follow these guidelines to prevent infection:  Follow healthcare providers instructions Make sure that you understand and can help the patient follow any healthcare provider instructions for all care.  Provide for the patients basic needs You should help the patient with basic needs in the home and provide support for getting groceries, prescriptions, and other personal needs.  Monitor the patients symptoms If they are getting sicker, call his or her medical provider a  This will help the healthcare providers office take steps to keep other people from getting infected. Ask the healthcare provider to call the local or state health department.  Limit the number of people who have contact with the patient If possible, have only one caregiver for the patient. Other household members should stay in another home or place of residence. If this is not possible, they should stay in another room, or be separated from the patient as much as possible. Use a separate bathroom, if available. Restrict visitors who do not have an essential need to be in the home.  Keep older adults, very young children, and other sick people away from the patient Keep older adults, very young children, and those who have compromised immune systems or chronic health conditions away from the patient. This includes people with chronic heart, lung, or kidney conditions, diabetes, and cancer.  Ensure good ventilation Make sure that shared spaces in the home have good air flow, such as from an air conditioner or an opened  window, weather permitting.  Wash your hands often Wash your hands often and thoroughly with soap and water for at least 20 seconds. You can use an alcohol based hand sanitizer if soap and water are not available and if your hands are not visibly dirty. Avoid touching your eyes, nose, and mouth with unwashed hands. Use disposable paper towels to dry your hands. If not available, use dedicated cloth towels and replace them when they become wet.  Wear a facemask and gloves Wear a disposable facemask at all times in the room and gloves when you touch or have contact with the patients blood, body fluids, and/or secretions or excretions, such as sweat, saliva, sputum, nasal mucus, vomit, urine, or feces.  Ensure the mask fits over your nose and mouth tightly, and do not touch it during use. Throw out disposable facemasks and gloves after using them. Do not reuse. Wash your hands immediately after removing your facemask and gloves. If your personal clothing becomes contaminated, carefully remove clothing and launder. Wash your hands after handling contaminated clothing. Place all used disposable facemasks, gloves, and other waste in a lined container before disposing them with other household waste. Remove gloves and wash your hands immediately after handling these items.  Do not share dishes, glasses, or other household items with the patient Avoid sharing household items. You should not share dishes, drinking glasses, cups, eating utensils, towels, bedding, or other items After the person uses these items, you should wash them thoroughly with soap and water.  Wash laundry thoroughly Immediately remove and wash clothes or bedding that have blood, body  fluids, and/or secretions or excretions, such as sweat, saliva, sputum, nasal mucus, vomit, urine, or feces, on them. Wear gloves when handling laundry from the patient. Read and follow directions on labels of laundry or clothing items and detergent.  In general, wash and dry with the warmest temperatures recommended on the label.  Clean all areas the individual has used often Clean all touchable surfaces, such as counters, tabletops, doorknobs, bathroom fixtures, toilets, phones, keyboards, tablets, and bedside tables, every day. Also, clean any surfaces that may have blood, body fluids, and/or secretions or excretions on them. Wear gloves when cleaning surfaces the patient has come in contact with. Use a diluted bleach solution (e.g., dilute bleach with 1 part bleach and 10 parts water) or a household disinfectant with a label that says EPA-registered for coronaviruses. To make a bleach solution at home, add 1 tablespoon of bleach to 1 quart (4 cups) of water. For a larger supply, add  cup of bleach to 1 gallon (16 cups) of water. Read labels of cleaning products and follow recommendations provided on product labels. Labels contain instructions for safe and effective use of the cleaning product including precautions you should take when applying the product, such as wearing gloves or eye protection and making sure you have good ventilation during use of the product. Remove gloves and wash hands immediately after cleaning.  Monitor yourself for signs and symptoms of illness Caregivers and household members are considered close contacts, should monitor their health, and will be asked to limit movement outside of the home to the extent possible. Follow the monitoring steps for close contacts listed on the symptom monitoring form.   ? If you have additional questions, contact your local health department or call the epidemiologist on call at [phone] (available 24/7). ? This guidance is subject to change. For the most up-to-date guidance from [facility], please refer to their website: [email or web address]

## 2018-11-27 ENCOUNTER — Telehealth (HOSPITAL_COMMUNITY): Payer: Self-pay | Admitting: Emergency Medicine

## 2018-11-27 NOTE — Telephone Encounter (Signed)
Treated with clindamycin. Patient contacted and made aware of all results, all questions answered.

## 2018-11-28 LAB — CULTURE, GROUP A STREP (THRC)

## 2019-06-20 ENCOUNTER — Encounter: Payer: Self-pay | Admitting: Family Medicine

## 2019-06-20 ENCOUNTER — Other Ambulatory Visit: Payer: Self-pay

## 2019-06-20 ENCOUNTER — Ambulatory Visit: Payer: Commercial Managed Care - PPO | Admitting: Family Medicine

## 2019-06-20 VITALS — BP 122/64 | HR 80 | Temp 98.3°F | Ht 68.0 in | Wt 163.0 lb

## 2019-06-20 DIAGNOSIS — Z1159 Encounter for screening for other viral diseases: Secondary | ICD-10-CM

## 2019-06-20 DIAGNOSIS — L853 Xerosis cutis: Secondary | ICD-10-CM

## 2019-06-20 DIAGNOSIS — Z1211 Encounter for screening for malignant neoplasm of colon: Secondary | ICD-10-CM

## 2019-06-20 DIAGNOSIS — Z13220 Encounter for screening for lipoid disorders: Secondary | ICD-10-CM

## 2019-06-20 DIAGNOSIS — Z0000 Encounter for general adult medical examination without abnormal findings: Secondary | ICD-10-CM | POA: Diagnosis not present

## 2019-06-20 DIAGNOSIS — Z1389 Encounter for screening for other disorder: Secondary | ICD-10-CM

## 2019-06-20 DIAGNOSIS — E7841 Elevated Lipoprotein(a): Secondary | ICD-10-CM

## 2019-06-20 NOTE — Progress Notes (Signed)
New Patient Office Visit  Subjective:  Patient ID: Ronnie Palmer, male    DOB: 04/19/1951  Age: 68 y.o. MRN: [id number]  CC:  Chief Complaint  Patient presents with  . New Patient (Initial Visit)    No previous PCP    HPI Kuper Rennels presents to establish primary healthcare provider.Pt is from [place]. Pt does not want to take any vaccinations. Pt still works as Location manager. Pt occasionally smokes   Last CPE-unknown Colonoscopy-referred today Dental-as needed Eye-as needed Exercise-walks around neighborhood  No past medical history on file.  No past surgical history on file.  No family history on file.  Social History   Socioeconomic History  . Marital status: Married    Spouse name: Not on file  . Number of children: Not on file  . Years of education: Not on file  . Highest education level: Not on file  Occupational History  . Not on file  Social Needs  . Financial resource strain: Not on file  . Food insecurity    Worry: Not on file    Inability: Not on file  . Transportation needs    Medical: Not on file    Non-medical: Not on file  Tobacco Use  . Smoking status: Current Every Day Smoker    Packs/day: 0.50  . Smokeless tobacco: Never Used  Substance and Sexual Activity  . Alcohol use: Yes    Comment: occasional  . Drug use: No  . Sexual activity: Yes  Lifestyle  . Physical activity    Days per week: Not on file    Minutes per session: Not on file  . Stress: Not on file  Relationships  . Social Musician on phone: Not on file    Gets together: Not on file    Attends religious service: Not on file    Active member of club or organization: Not on file    Attends meetings of clubs or organizations: Not on file    Relationship status: Not on file  . Intimate partner violence    Fear of current or ex partner: Not on file    Emotionally abused: Not on file    Physically abused: Not on file    Forced sexual activity:  Not on file  Other Topics Concern  . Not on file  Social History Narrative  . Not on file    ROS Review of Systems  Constitutional: Negative for activity change, appetite change, fatigue, fever and unexpected weight change.  HENT: Negative for congestion, ear discharge, ear pain, mouth sores, rhinorrhea, sinus pressure and sinus pain.        Left ear painless discoloration in ear  Eyes: Negative for pain and discharge.  Respiratory: Negative for cough, chest tightness, shortness of breath and wheezing.   Cardiovascular: Negative for chest pain, palpitations and leg swelling.  Gastrointestinal: Negative for abdominal pain, blood in stool, constipation, diarrhea, nausea and vomiting.  Genitourinary: Negative for difficulty urinating.  Musculoskeletal: Negative for arthralgias, back pain and gait problem.  Skin:       Mole right beside left ear  Neurological: Negative for dizziness, weakness, numbness and headaches.     BP 122/64 (BP Location: Left Arm, Patient Position: Sitting, Cuff Size: Normal)   Pulse 80   Temp 98.3 F (36.8 C) (Temporal)   Ht 5\' 8"  (1.727 m)   Wt 73.9 kg   SpO2 97%   BMI 24.78 kg/m  Objective:   Today's Vitals: There  were no vitals taken for this visit.  Physical Exam Vitals signs and nursing note reviewed.  Constitutional:      Appearance: Normal appearance. He is normal weight.  HENT:     Head: Normocephalic and atraumatic.     Right Ear: Tympanic membrane, ear canal and external ear normal.     Left Ear: Tympanic membrane, ear canal and external ear normal.     Nose: Nose normal. No congestion or rhinorrhea.     Mouth/Throat:     Mouth: Mucous membranes are moist.     Pharynx: Oropharynx is clear. No oropharyngeal exudate or posterior oropharyngeal erythema.  Eyes:     General: No scleral icterus.       Right eye: No discharge.        Left eye: No discharge.     Extraocular Movements: Extraocular movements intact.     Conjunctiva/sclera:  Conjunctivae normal.     Pupils: Pupils are equal, round, and reactive to light.  Cardiovascular:     Rate and Rhythm: Normal rate and regular rhythm.     Pulses: Normal pulses.     Heart sounds: Normal heart sounds.  Pulmonary:     Effort: Pulmonary effort is normal. No respiratory distress.     Breath sounds: Normal breath sounds. No rhonchi or rales.  Abdominal:     General: Bowel sounds are normal. There is no distension.     Palpations: Abdomen is soft.     Tenderness: There is no abdominal tenderness.  Musculoskeletal:        General: No swelling.     Right lower leg: No edema.     Left lower leg: No edema.     Comments: Right knee tenderness  Skin:    General: Skin is warm and dry.  Neurological:     Mental Status: He is alert and oriented to person, place, and time. Mental status is at baseline.  Psychiatric:        Mood and Affect: Mood normal.        Behavior: Behavior normal.        Thought Content: Thought content normal.     Assessment & Plan:   1. Encounter for hepatitis C screening test for low risk patient - Hepatitis C antibody  2. Screening for lipid disorders - Lipid Panel  3. Screening for nephropathy - Comprehensive metabolic panel  4. Dry skin Pt advised to apply OTC cream after shower.  - CBC with Differential - TSH  5. Colon cancer screening - Ambulatory referral to Gastroenterology  Problem List Items Addressed This Visit    None      Outpatient Encounter Medications as of 06/20/2019  Medication Sig  . [DISCONTINUED] clotrimazole-betamethasone (LOTRISONE) cream Apply to affected area 3 times daily for 2 weeks (Patient not taking: Reported on 06/20/2019)  . [DISCONTINUED] triamcinolone cream (KENALOG) 0.1 % Apply 1 application topically 2 (two) times daily. (Patient not taking: Reported on 06/20/2019)   No facility-administered encounter medications on file as of 06/20/2019.     Follow-up: No follow-ups on file.   Rica Koyanagi,  RN

## 2019-06-20 NOTE — Progress Notes (Signed)
Subjective:    Patient ID: Ronnie Palmer, male    DOB: 01-04-1951, 68 y.o.   MRN: [id number]  HPI This is a 68 yo male who presents today to establish care. Accompanied by wife and daughter who is translating for them. Requests CPE today. 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 Enjoys his work.   Last CPE- many years ago, no recent CPE PSA- unsure Colonoscopy- never, open to screening Tdap- declines, does not take vaccines Flu and pneumonia- declines Dental- not regular Exercise- walks several times a week   No past medical history on file. Past Surgical History:  Procedure Laterality Date  . NO PAST SURGERIES     No family history on file. Social History   Tobacco Use  . Smoking status: Current Every Day Smoker    Packs/day: 0.25    Years: 40.00    Pack years: 10.00    Types: Cigarettes  . Smokeless tobacco: Never Used  Substance Use Topics  . Alcohol use: Yes    Comment: occasional  . Drug use: No      Review of Systems  Constitutional: Negative.   HENT: Negative.   Eyes: Negative.   Respiratory: Negative.   Cardiovascular: Negative.   Gastrointestinal: Negative.   Endocrine: Negative.   Genitourinary: Negative.   Musculoskeletal: Negative.   Skin:       Dry skin on shins. Old rash that has resolved but still occasionally itches. Skin dry, uses Vaseline.   Allergic/Immunologic: Negative.   Neurological: Negative.   Hematological: Negative.   Psychiatric/Behavioral: Negative.        Objective:   Physical Exam Vitals signs reviewed.  Constitutional:      General: He is not in acute distress.    Appearance: Normal appearance. He is normal weight. He is not ill-appearing, toxic-appearing or diaphoretic.  HENT:     Head: Normocephalic and atraumatic.     Right Ear: Tympanic membrane, ear canal and external ear normal.     Left Ear: Tympanic membrane and ear canal normal.     Ears:     Comments: Left ear canal with approximately 4 mm  dark, skin tag. Inner pinna with open comedone. Both lesions long standing (years) per patient.  Eyes:     Conjunctiva/sclera: Conjunctivae normal.  Neck:     Musculoskeletal: Normal range of motion and neck supple. No neck rigidity or muscular tenderness.  Cardiovascular:     Rate and Rhythm: Normal rate and regular rhythm.     Heart sounds: Normal heart sounds.  Pulmonary:     Effort: Pulmonary effort is normal.     Breath sounds: Normal breath sounds.  Abdominal:     General: Abdomen is flat. Bowel sounds are normal. There is no distension.     Palpations: Abdomen is soft. There is no mass.     Tenderness: There is no abdominal tenderness. There is no guarding or rebound.     Hernia: No hernia is present.  Musculoskeletal: Normal range of motion.     Right lower leg: No edema.     Left lower leg: No edema.  Lymphadenopathy:     Cervical: No cervical adenopathy.  Skin:    General: Skin is warm and dry.     Comments: Bilateral anterior lower legs with multiple, discreet area of hyperpigmentation. Not raised. Appear old.   Neurological:     Mental Status: He is alert and oriented to person, place, and time.  Psychiatric:  Mood and Affect: Mood normal.        Behavior: Behavior normal.       BP 122/64 (BP Location: Left Arm, Patient Position: Sitting, Cuff Size: Normal)   Pulse 80   Temp 98.3 F (36.8 C) (Temporal)   Ht 5\' 8"  (1.727 m)   Wt 163 lb (73.9 kg)   SpO2 97%   BMI 24.78 kg/m  Depression screen [facility] 2/9 06/20/2019  Decreased Interest 0  Down, Depressed, Hopeless 0  PHQ - 2 Score 0       Assessment & Plan:  1. Annual physical exam - Discussed and encouraged healthy lifestyle choices- adequate sleep, regular exercise, stress management and healthy food choices.  - patient was offered and declined Tdap, Prevnar 13 and flu vaccines  2. Encounter for hepatitis C screening test for low risk patient - Hepatitis C antibody  3. Screening for lipid  disorders - Lipid Panel  4. Screening for nephropathy - Comprehensive metabolic panel  5. Dry skin - CBC with Differential - TSH  6. Colon cancer screening - Ambulatory referral to Gastroenterology  - follow up in 1 year for annual exam 06/22/2019, FNP-BC  [facility], [facility]  06/23/2019 8:26 AM

## 2019-06-20 NOTE — Patient Instructions (Signed)
Health Maintenance After Age 67 After age 67, you are at a higher risk for certain long-term diseases and infections as well as injuries from falls. Falls are a major cause of broken bones and head injuries in people who are older than age 67. Getting regular preventive care can help to keep you healthy and well. Preventive care includes getting regular testing and making lifestyle changes as recommended by your health care provider. Talk with your health care provider about:  Which screenings and tests you should have. A screening is a test that checks for a disease when you have no symptoms.  A diet and exercise plan that is right for you. What should I know about screenings and tests to prevent falls? Screening and testing are the best ways to find a health problem early. Early diagnosis and treatment give you the best chance of managing medical conditions that are common after age 67. Certain conditions and lifestyle choices may make you more likely to have a fall. Your health care provider may recommend:  Regular vision checks. Poor vision and conditions such as cataracts can make you more likely to have a fall. If you wear glasses, make sure to get your prescription updated if your vision changes.  Medicine review. Work with your health care provider to regularly review all of the medicines you are taking, including over-the-counter medicines. Ask your health care provider about any side effects that may make you more likely to have a fall. Tell your health care provider if any medicines that you take make you feel dizzy or sleepy.  Osteoporosis screening. Osteoporosis is a condition that causes the bones to get weaker. This can make the bones weak and cause them to break more easily.  Blood pressure screening. Blood pressure changes and medicines to control blood pressure can make you feel dizzy.  Strength and balance checks. Your health care provider may recommend certain tests to check your  strength and balance while standing, walking, or changing positions.  Foot health exam. Foot pain and numbness, as well as not wearing proper footwear, can make you more likely to have a fall.  Depression screening. You may be more likely to have a fall if you have a fear of falling, feel emotionally low, or feel unable to do activities that you used to do.  Alcohol use screening. Using too much alcohol can affect your balance and may make you more likely to have a fall. What actions can I take to lower my risk of falls? General instructions  Talk with your health care provider about your risks for falling. Tell your health care provider if: ? You fall. Be sure to tell your health care provider about all falls, even ones that seem minor. ? You feel dizzy, sleepy, or off-balance.  Take over-the-counter and prescription medicines only as told by your health care provider. These include any supplements.  Eat a healthy diet and maintain a healthy weight. A healthy diet includes low-fat dairy products, low-fat (lean) meats, and fiber from whole grains, beans, and lots of fruits and vegetables. Home safety  Remove any tripping hazards, such as rugs, cords, and clutter.  Install safety equipment such as grab bars in bathrooms and safety rails on stairs.  Keep rooms and walkways well-lit. Activity   Follow a regular exercise program to stay fit. This will help you maintain your balance. Ask your health care provider what types of exercise are appropriate for you.  If you need a cane or   walker, use it as recommended by your health care provider.  Wear supportive shoes that have nonskid soles. Lifestyle  Do not drink alcohol if your health care provider tells you not to drink.  If you drink alcohol, limit how much you have: ? 0-1 drink a day for women. ? 0-2 drinks a day for men.  Be aware of how much alcohol is in your drink. In the [place] one drink equals one typical bottle of beer (12  oz), one-half glass of wine (5 oz), or one shot of hard liquor (1 oz).  Do not use any products that contain nicotine or tobacco, such as cigarettes and e-cigarettes. If you need help quitting, ask your health care provider. Summary  Having a healthy lifestyle and getting preventive care can help to protect your health and wellness after age 67.  Screening and testing are the best way to find a health problem early and help you avoid having a fall. Early diagnosis and treatment give you the best chance for managing medical conditions that are more common for people who are older than age 67.  Falls are a major cause of broken bones and head injuries in people who are older than age 67. Take precautions to prevent a fall at home.  Work with your health care provider to learn what changes you can make to improve your health and wellness and to prevent falls. This information is not intended to replace advice given to you by your health care provider. Make sure you discuss any questions you have with your health care provider. Document Released: 06/29/2017 Document Revised: 12/07/2018 Document Reviewed: 06/29/2017 Elsevier Patient Education  2020 [employer]  

## 2019-06-21 LAB — HEPATITIS C ANTIBODY
Hepatitis C Ab: NONREACTIVE
SIGNAL TO CUT-OFF: 0.05 (ref <1.00)

## 2019-06-21 LAB — COMPREHENSIVE METABOLIC PANEL
ALT: 16 U/L (ref 0–53)
AST: 20 U/L (ref 0–37)
Albumin: 4.3 g/dL (ref 3.5–5.2)
Alkaline Phosphatase: 80 U/L (ref 39–117)
BUN: 20 mg/dL (ref 6–23)
CO2: 27 mEq/L (ref 19–32)
Calcium: 9.3 mg/dL (ref 8.4–10.5)
Chloride: 102 mEq/L (ref 96–112)
Creatinine, Ser: 0.91 mg/dL (ref 0.40–1.50)
GFR: 83.09 mL/min (ref >60.00)
Glucose, Bld: 94 mg/dL (ref 70–99)
Potassium: 4 mEq/L (ref 3.5–5.1)
Sodium: 137 mEq/L (ref 135–145)
Total Bilirubin: 0.4 mg/dL (ref 0.2–1.2)
Total Protein: 8 g/dL (ref 6.0–8.3)

## 2019-06-21 LAB — CBC WITH DIFFERENTIAL/PLATELET
Basophils Absolute: 0.1 10*3/uL (ref 0.0–0.1)
Basophils Relative: 0.7 % (ref 0.0–3.0)
Eosinophils Absolute: 0.6 10*3/uL (ref 0.0–0.7)
Eosinophils Relative: 6.8 % — ABNORMAL HIGH (ref 0.0–5.0)
HCT: 42.2 % (ref 39.0–52.0)
Hemoglobin: 13.6 g/dL (ref 13.0–17.0)
Lymphocytes Relative: 26.3 % (ref 12.0–46.0)
Lymphs Abs: 2.5 10*3/uL (ref 0.7–4.0)
MCHC: 32.2 g/dL (ref 30.0–36.0)
MCV: 83.3 fl (ref 78.0–100.0)
Monocytes Absolute: 0.8 10*3/uL (ref 0.1–1.0)
Monocytes Relative: 8.1 % (ref 3.0–12.0)
Neutro Abs: 5.5 10*3/uL (ref 1.4–7.7)
Neutrophils Relative %: 58.1 % (ref 43.0–77.0)
Platelets: 255 10*3/uL (ref 150.0–400.0)
RBC: 5.06 Mil/uL (ref 4.22–5.81)
RDW: 13.7 % (ref 11.5–15.5)
WBC: 9.4 10*3/uL (ref 4.0–10.5)

## 2019-06-21 LAB — LIPID PANEL
Cholesterol: 199 mg/dL (ref 0–200)
HDL: 48.7 mg/dL (ref >39.00)
LDL Cholesterol: 132 mg/dL — ABNORMAL HIGH (ref 0–99)
NonHDL: 150.15
Total CHOL/HDL Ratio: 4
Triglycerides: 89 mg/dL (ref 0.0–149.0)
VLDL: 17.8 mg/dL (ref 0.0–40.0)

## 2019-06-21 LAB — TSH: TSH: 0.76 u[IU]/mL (ref 0.35–4.50)

## 2019-06-23 ENCOUNTER — Encounter: Payer: Self-pay | Admitting: Family Medicine

## 2019-06-24 MED ORDER — ATORVASTATIN CALCIUM 20 MG PO TABS: 20.0000 mg | ORAL_TABLET | Freq: Every day | ORAL | 3 refills | Status: DC

## 2019-06-24 NOTE — Addendum Note (Signed)
Addended by: Clarene Reamer B on: 06/24/2019 03:28 PM   Modules accepted: Orders

## 2020-01-23 ENCOUNTER — Encounter: Payer: Self-pay | Admitting: Family Medicine

## 2020-01-23 ENCOUNTER — Ambulatory Visit (INDEPENDENT_AMBULATORY_CARE_PROVIDER_SITE_OTHER)
Admission: RE | Admit: 2020-01-23 | Discharge: 2020-01-23 | Disposition: A | Discharge status: Home / Self Care | Payer: Commercial Managed Care - PPO | Source: Ambulatory Visit | Attending: Family Medicine | Admitting: Family Medicine

## 2020-01-23 ENCOUNTER — Other Ambulatory Visit: Payer: Self-pay

## 2020-01-23 ENCOUNTER — Ambulatory Visit: Payer: Commercial Managed Care - PPO | Admitting: Family Medicine

## 2020-01-23 VITALS — BP 150/80 | HR 85 | Temp 97.7°F | Ht 68.0 in | Wt 170.5 lb

## 2020-01-23 DIAGNOSIS — M1120 Other chondrocalcinosis, unspecified site: Secondary | ICD-10-CM

## 2020-01-23 DIAGNOSIS — M11261 Other chondrocalcinosis, right knee: Secondary | ICD-10-CM

## 2020-01-23 DIAGNOSIS — M25561 Pain in right knee: Secondary | ICD-10-CM

## 2020-01-23 MED ORDER — COLCHICINE 0.6 MG PO TABS: 0.6000 mg | ORAL_TABLET | Freq: Two times a day (BID) | ORAL | 2 refills | Status: DC

## 2020-01-23 NOTE — Progress Notes (Signed)
Spencer T. Copland, MD, CAQ Sports Medicine  Primary Care and Sports Medicine [facility] at [facility] [address] [place], [address]  Phone: [phone]  FAX: [phone]  [name] - 69 y.o. male  MRN [id number]  Date of Birth: April 23, 1951  Date: 01/23/2020  PCP: Emi Belfast, FNP  Referral: Emi Belfast, FNP  Chief Complaint  Patient presents with  . Knee Pain    Right    This visit occurred during the SARS-CoV-2 public health emergency.  Safety protocols were in place, including screening questions prior to the visit, additional usage of staff PPE, and extensive cleaning of exam room while observing appropriate contact time as indicated for disinfecting solutions.   Subjective:   Ronnie Palmer is a 69 y.o. very pleasant male patient with Body mass index is 25.92 kg/m. who presents with the following:  R knee pain:   He describes an ongoing issue for about the last 3 to 4 weeks where he will have knee swelling, redness, and warmth after he is shift at a sock [employer].  He has had some intermittent knee swelling for some time.  At this point it is only affecting the right knee.  He is not had any injury or trauma.  He has not had any old longstanding knee issues or surgery.  He is not having any some functional giving way or locking up of the joint.  His knee urts relly bad, no injury.  About 3 weeks.  No old injury.  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  Works in [employer]arden/r[employer].    Review of Systems is noted in the HPI, as appropriate   Objective:   BP (!) 150/80   Pulse 85   Temp 97.7 F (36.5 C) (Temporal)   Ht 5\' 8"  (1.727 m)   Wt 170 lb 8 oz (77.3 kg)   SpO2 95%   BMI 25.92 kg/m    GEN: No acute distress; alert,appropriate. PULM: Breathing comfortably in no respiratory distress PSYCH: Normally interactive.    Right knee: Full extension.  Flexion to 120  degrees.  There is a moderate ballotable effusion.  Does have some tenderness on the medial joint line greater than the lateral joint line.  All ligamentous structures are intact.  He does have some pain with deep flexion as well as bounce home testing.  McMurray's does cause some pain.  All tendinous structures are nontender.  Radiology: DG Knee 4 Views W/Patella Right  Result Date: 01/24/2020 CLINICAL DATA:  Right knee pain. EXAM: RIGHT KNEE - COMPLETE 4+ VIEW COMPARISON:  No prior. FINDINGS: Mild chondrocalcinosis noted. This is most likely degenerative. No acute bony abnormality identified. No evidence of fracture dislocation. Tiny knee joint effusion cannot be excluded. IMPRESSION: Mild chondrocalcinosis, most likely degenerative. No acute bony abnormality identified. Tiny knee joint effusion cannot be excluded. Electronically Signed   By: 01/26/2020  Register   On: 01/24/2020 07:07     Assessment and Plan:     ICD-10-CM   1. Pseudogout  M11.20   2. Acute pain of right knee  M25.561 DG Knee 4 Views W/Patella Right  3. 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  I am going to place him on some colchicine, and if this rapidly improves his condition, then you can safely say that the diagnosis is  CPPD.  If this does not help then multiple other modalities could potentially be used including oral NSAIDs, ice, oral corticosteroids or aspiration and injection.  Follow-up: No follow-ups on file.  Meds ordered this encounter  Medications  . colchicine 0.6 MG tablet    Sig: Take 1 tablet (0.6 mg total) by mouth 2 (two) times daily.    Dispense:  60 tablet    Refill:  2   There are no discontinued medications. Orders Placed This Encounter  Procedures  . DG Knee 4 Views W/Patella Right    Signed,  Frederico Hamman T. Copland, MD   Outpatient Encounter Medications as of 01/23/2020  Medication  Sig  . atorvastatin (LIPITOR) 20 MG tablet Take 1 tablet (20 mg total) by mouth daily.  . colchicine 0.6 MG tablet Take 1 tablet (0.6 mg total) by mouth 2 (two) times daily.   No facility-administered encounter medications on file as of 01/23/2020.

## 2020-06-29 ENCOUNTER — Other Ambulatory Visit: Payer: Self-pay | Admitting: Family Medicine

## 2020-06-29 DIAGNOSIS — E7841 Elevated Lipoprotein(a): Secondary | ICD-10-CM

## 2020-07-22 ENCOUNTER — Encounter: Payer: Self-pay | Admitting: Family Medicine

## 2020-07-22 ENCOUNTER — Other Ambulatory Visit: Payer: Self-pay | Admitting: Family Medicine

## 2020-07-22 DIAGNOSIS — Z125 Encounter for screening for malignant neoplasm of prostate: Secondary | ICD-10-CM

## 2020-07-22 DIAGNOSIS — E7841 Elevated Lipoprotein(a): Secondary | ICD-10-CM

## 2020-07-23 ENCOUNTER — Other Ambulatory Visit: Payer: Self-pay

## 2020-07-23 ENCOUNTER — Other Ambulatory Visit (INDEPENDENT_AMBULATORY_CARE_PROVIDER_SITE_OTHER): Payer: Commercial Managed Care - PPO

## 2020-07-23 DIAGNOSIS — Z125 Encounter for screening for malignant neoplasm of prostate: Secondary | ICD-10-CM | POA: Diagnosis not present

## 2020-07-23 DIAGNOSIS — E7841 Elevated Lipoprotein(a): Secondary | ICD-10-CM | POA: Diagnosis not present

## 2020-07-23 LAB — LIPID PANEL
Cholesterol: 139 mg/dL (ref 0–200)
HDL: 54.8 mg/dL (ref >39.00)
LDL Cholesterol: 69 mg/dL (ref 0–99)
NonHDL: 84.34
Total CHOL/HDL Ratio: 3
Triglycerides: 76 mg/dL (ref 0.0–149.0)
VLDL: 15.2 mg/dL (ref 0.0–40.0)

## 2020-07-23 LAB — COMPREHENSIVE METABOLIC PANEL
ALT: 16 U/L (ref 0–53)
AST: 17 U/L (ref 0–37)
Albumin: 4 g/dL (ref 3.5–5.2)
Alkaline Phosphatase: 75 U/L (ref 39–117)
BUN: 19 mg/dL (ref 6–23)
CO2: 28 mEq/L (ref 19–32)
Calcium: 9.3 mg/dL (ref 8.4–10.5)
Chloride: 103 mEq/L (ref 96–112)
Creatinine, Ser: 0.76 mg/dL (ref 0.40–1.50)
GFR: 92.61 mL/min (ref >60.00)
Glucose, Bld: 90 mg/dL (ref 70–99)
Potassium: 3.7 mEq/L (ref 3.5–5.1)
Sodium: 139 mEq/L (ref 135–145)
Total Bilirubin: 0.3 mg/dL (ref 0.2–1.2)
Total Protein: 7.1 g/dL (ref 6.0–8.3)

## 2020-07-23 LAB — PSA, MEDICARE: PSA: 0.84 ng/ml (ref 0.10–4.00)

## 2020-07-30 ENCOUNTER — Other Ambulatory Visit: Payer: Self-pay

## 2020-07-30 ENCOUNTER — Ambulatory Visit (INDEPENDENT_AMBULATORY_CARE_PROVIDER_SITE_OTHER): Payer: Commercial Managed Care - PPO | Admitting: Family Medicine

## 2020-07-30 ENCOUNTER — Encounter: Payer: Self-pay | Admitting: Family Medicine

## 2020-07-30 VITALS — BP 130/76 | HR 68 | Temp 97.6°F | Ht 68.0 in | Wt 170.0 lb

## 2020-07-30 DIAGNOSIS — Z0000 Encounter for general adult medical examination without abnormal findings: Secondary | ICD-10-CM | POA: Diagnosis not present

## 2020-07-30 DIAGNOSIS — Z1211 Encounter for screening for malignant neoplasm of colon: Secondary | ICD-10-CM

## 2020-07-30 DIAGNOSIS — L853 Xerosis cutis: Secondary | ICD-10-CM | POA: Diagnosis not present

## 2020-07-30 DIAGNOSIS — E7841 Elevated Lipoprotein(a): Secondary | ICD-10-CM

## 2020-07-30 NOTE — Patient Instructions (Addendum)
For leg itching, can use hydrocortisone cream  You will get a call about colon cancer screening

## 2020-07-30 NOTE — Progress Notes (Signed)
Subjective:    Patient ID: Ronnie Palmer, male    DOB: 24-Mar-1951, 69 y.o.   MRN: [id number]  HPI Chief Complaint  Patient presents with  . Annual Exam    Here with son. Fully Vaccinated against Covid x3.   This is a 69 yo male who presents today for annual exam. He is accompanied by son, Caryn Bee, who assists with translation. Also with wife who is getting CPE today as well.     Last CPE- 06/20/2019 PSA- 07/23/2020 Colonoscopy- was referred last  Year, did not return call Tdap- unsure Flu- declines Covid- fully vaccinaged Dental- over due Eye- has had this year Exercise- walks every day   Hyperlipidemia- started on atorvastatin last year, tolerating well. LDL reduced from 132 to 69.   Review of Systems  Constitutional: Negative.   HENT: Negative.   Eyes: Negative.   Respiratory: Negative.   Cardiovascular: Negative.   Endocrine: Negative.   Genitourinary: Negative.   Musculoskeletal: Negative.   Skin:       Lower leg with intermittent itching, no rash.   Allergic/Immunologic: Negative.   Neurological: Negative.   Hematological: Negative.   Psychiatric/Behavioral: Negative.        Objective:   Physical Exam Physical Exam  Constitutional: He is oriented to person, place, and time. He appears well-developed and well-nourished.  HENT:  Head: Normocephalic and atraumatic.  Right Ear: External ear normal. TM normal.  Left Ear: External ear normal.  TM normal.  Nose: Nose normal.  Mouth/Throat: Oropharynx is clear and moist.  Eyes: Conjunctivae are normal.  Neck: Normal range of motion. Neck supple.  Cardiovascular: Normal rate, regular rhythm, normal heart sounds and intact distal pulses.   Pulmonary/Chest: Effort normal and breath sounds normal.  Abdominal: Soft. Bowel sounds are normal. Hernia confirmed negative in the right inguinal area and confirmed negative in the left inguinal area.  Musculoskeletal: Normal range of motion. He exhibits no edema or  tenderness.       Cervical back: Normal.       Thoracic back: Normal.       Lumbar back: Normal.  Lymphadenopathy:    He has no cervical adenopathy.       Right: No inguinal adenopathy present.       Left: No inguinal adenopathy present.  Neurological: He is alert and oriented to person, place, and time.  Skin: Skin is warm and dry. Lower extremities with some dry skin, healing excoriations (scratching).  Psychiatric: He has a normal mood and affect. His behavior is normal. Judgment normal.  Vitals reviewed.     BP 130/76 (BP Location: Left Arm, Patient Position: Sitting, Cuff Size: Large)   Pulse 68   Temp 97.6 F (36.4 C)   Ht 5\' 8"  (1.727 m)   Wt 170 lb (77.1 kg)   SpO2 97%   BMI 25.85 kg/m  Wt Readings from Last 3 Encounters:  07/30/20 170 lb (77.1 kg)  01/23/20 170 lb 8 oz (77.3 kg)  06/20/19 163 lb (73.9 kg)       Assessment & Plan:  1. Annual physical exam - reviewed health maintenance recommendations with patient, he declines Tdap, pneumonia, flu vaccines. Agrees to colon cancer screening  2. Colon cancer screening - Ambulatory referral to Gastroenterology  3. Dry skin - discussed otc hydrocortisone cream, lotions  4. Elevated lipoprotein(a) - tolerating atorvastatin with LDL at goal, continue 20 mg dose  - follow up in 1 year  This visit occurred during the SARS-CoV-2 public health  emergency.  Safety protocols were in place, including screening questions prior to the visit, additional usage of staff PPE, and extensive cleaning of exam room while observing appropriate contact time as indicated for disinfecting solutions.      Olean Ree, FNP-BC  [facility] at [facility], [facility]  08/02/2020 9:51 AM

## 2020-08-02 ENCOUNTER — Encounter: Payer: Self-pay | Admitting: Family Medicine

## 2020-08-02 DIAGNOSIS — L853 Xerosis cutis: Secondary | ICD-10-CM | POA: Insufficient documentation

## 2020-08-02 DIAGNOSIS — E7841 Elevated Lipoprotein(a): Secondary | ICD-10-CM | POA: Insufficient documentation

## 2020-10-22 ENCOUNTER — Telehealth: Payer: Self-pay

## 2020-10-22 DIAGNOSIS — E7841 Elevated Lipoprotein(a): Secondary | ICD-10-CM

## 2020-10-22 MED ORDER — ATORVASTATIN CALCIUM 20 MG PO TABS: 20.0000 mg | ORAL_TABLET | Freq: Every day | ORAL | 0 refills | Status: DC

## 2020-10-22 NOTE — Telephone Encounter (Signed)
Pharmacy requests refill on: Atorvastatin 20 mg   LAST REFILL: 06/30/2020 (Q-90, R-0) LAST OV: 07/30/2020 NEXT OV: Not Scheduled  PHARMACY: [id number] [place], [place]

## 2021-09-17 IMAGING — DX DG KNEE COMPLETE 4+V*R*
4 series · 4 of 4 positions shown · non-contrast
Comparison: No prior.

CLINICAL DATA: Right knee pain.

EXAM:
RIGHT KNEE - COMPLETE 4+ VIEW

[knee ap]
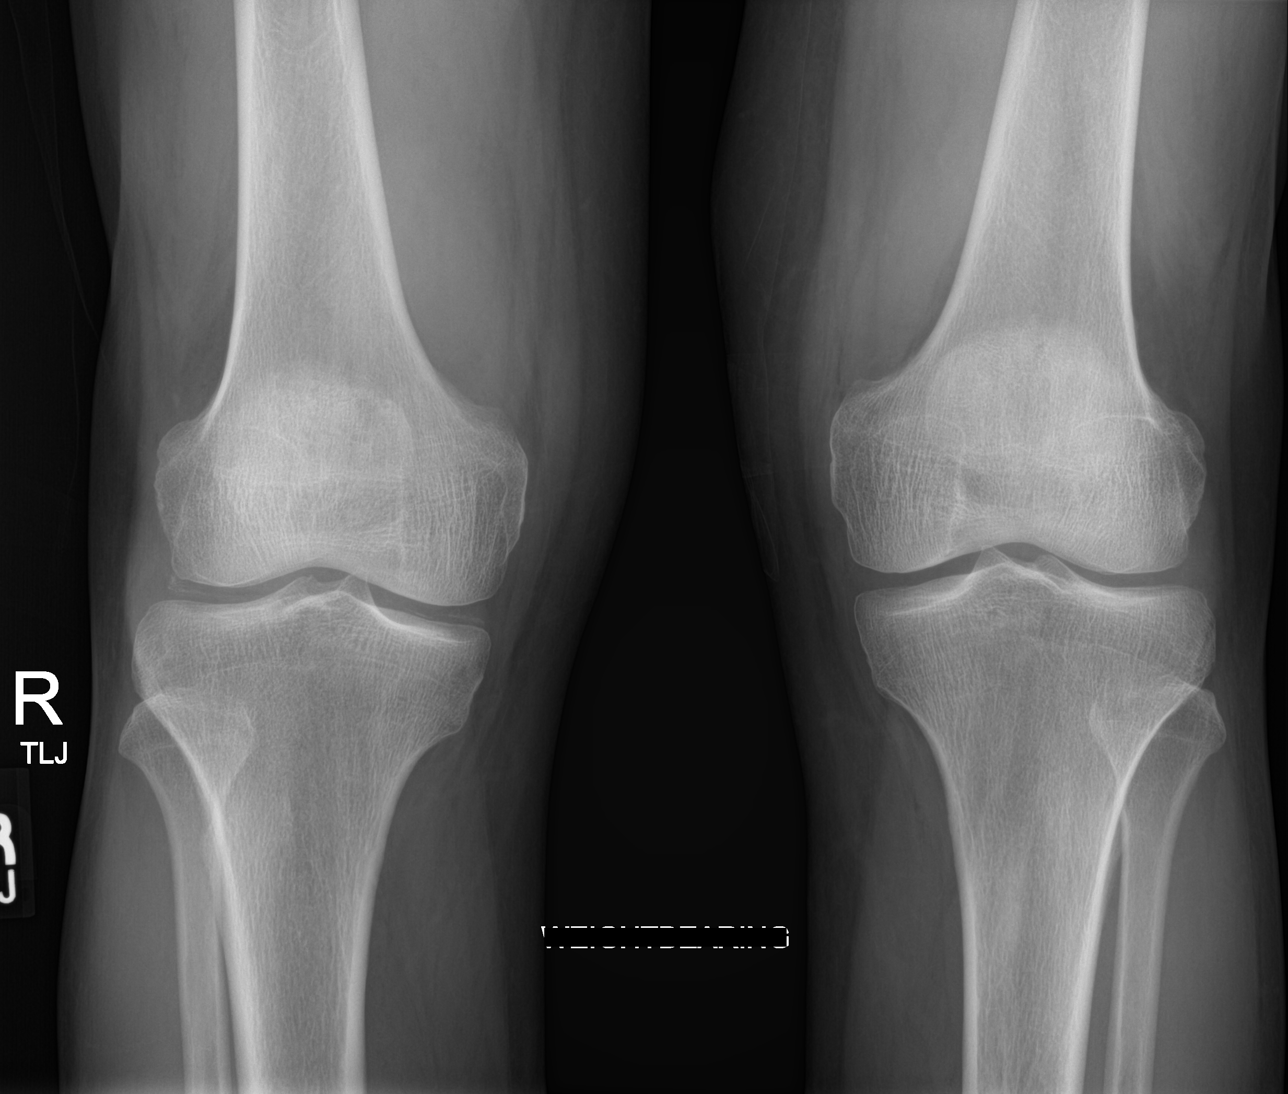

[knee tunnel]
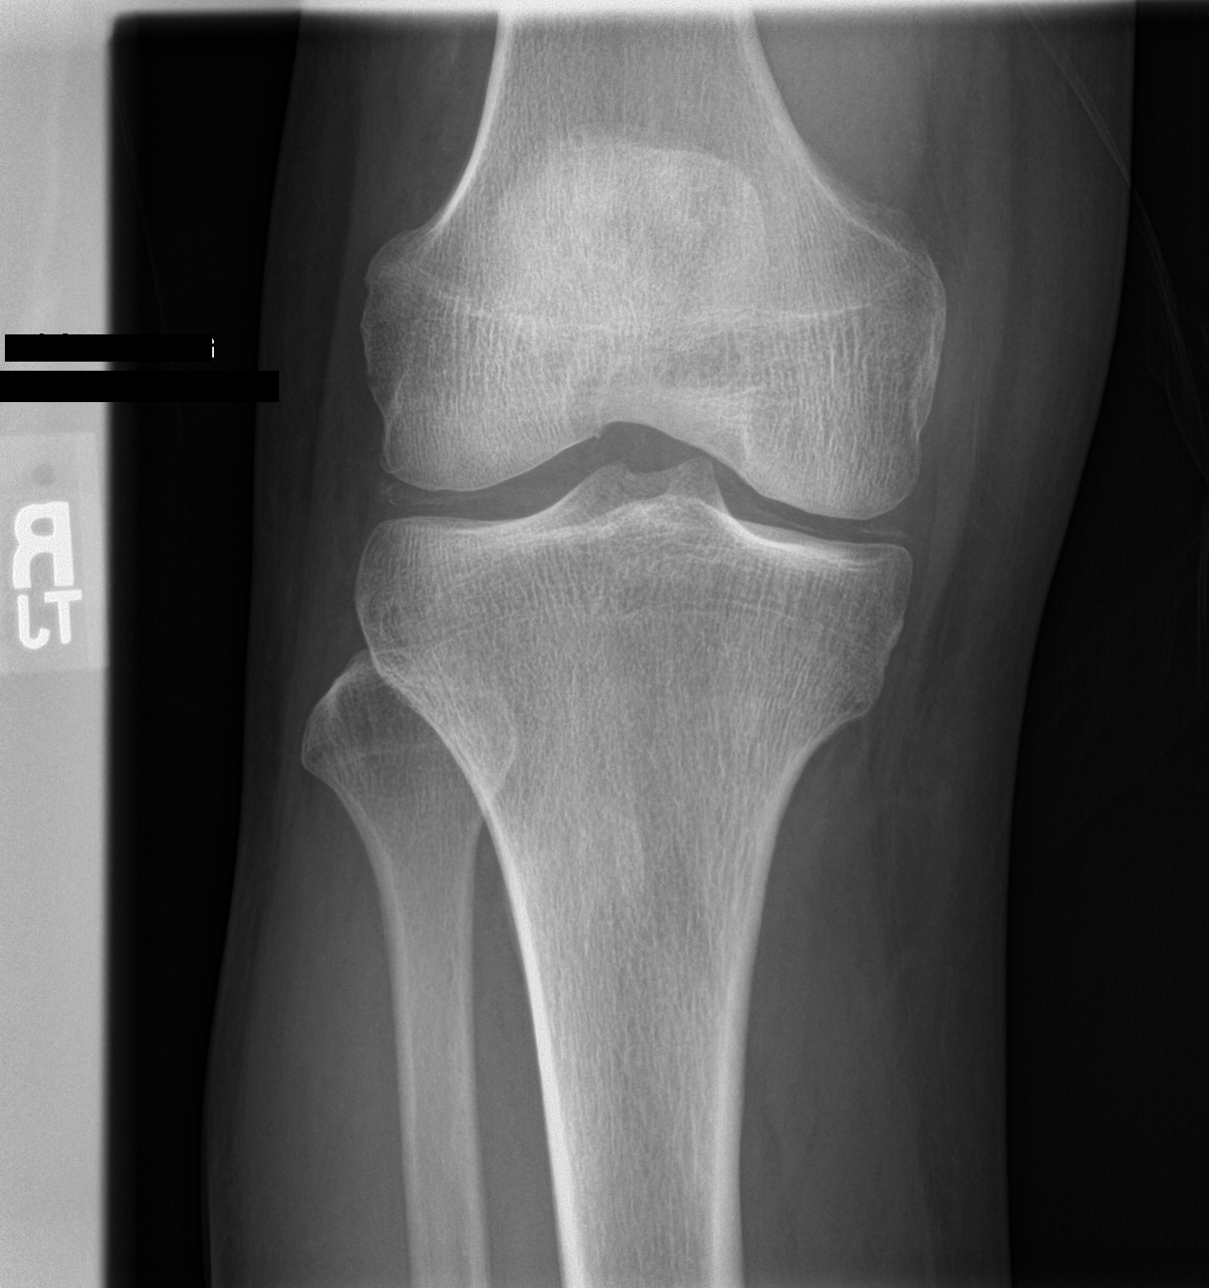

[knee lat]
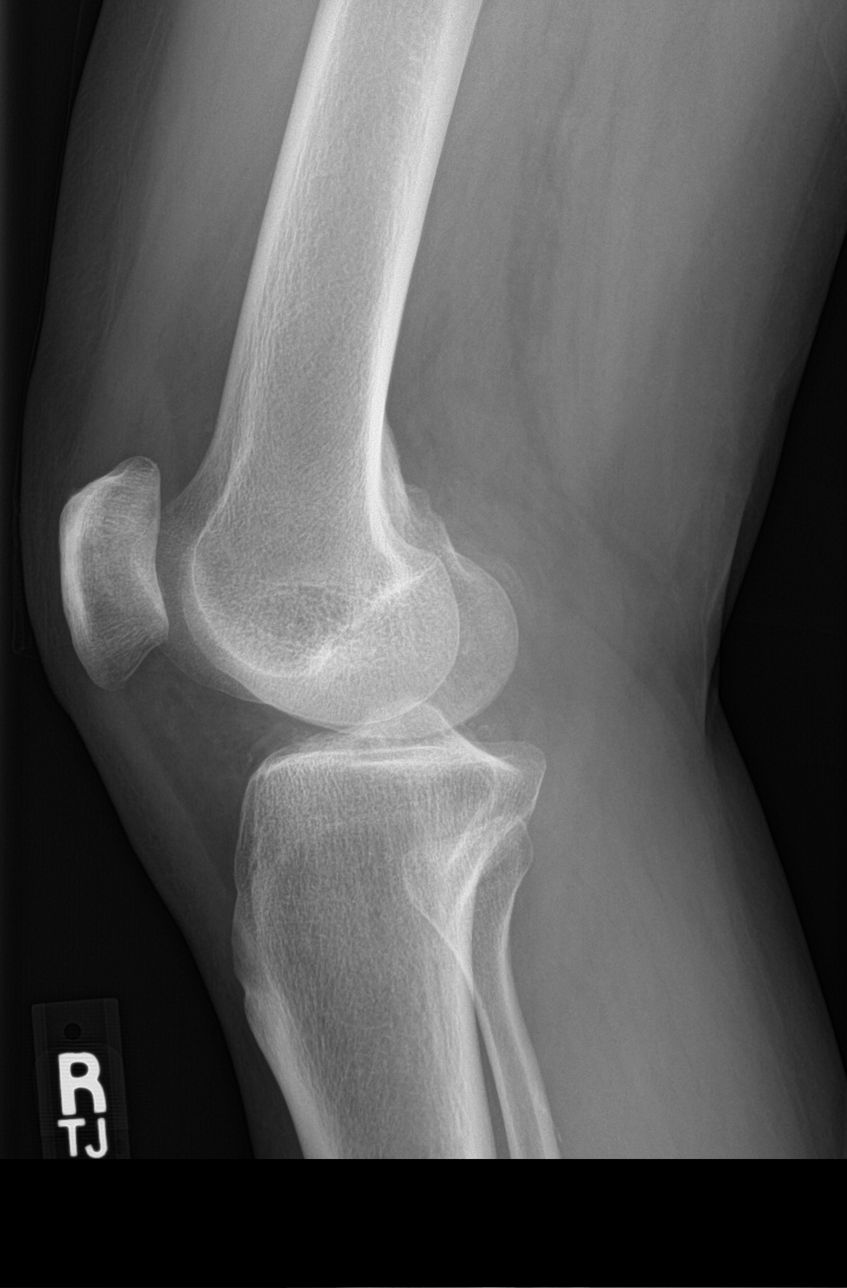

[patella skyline]
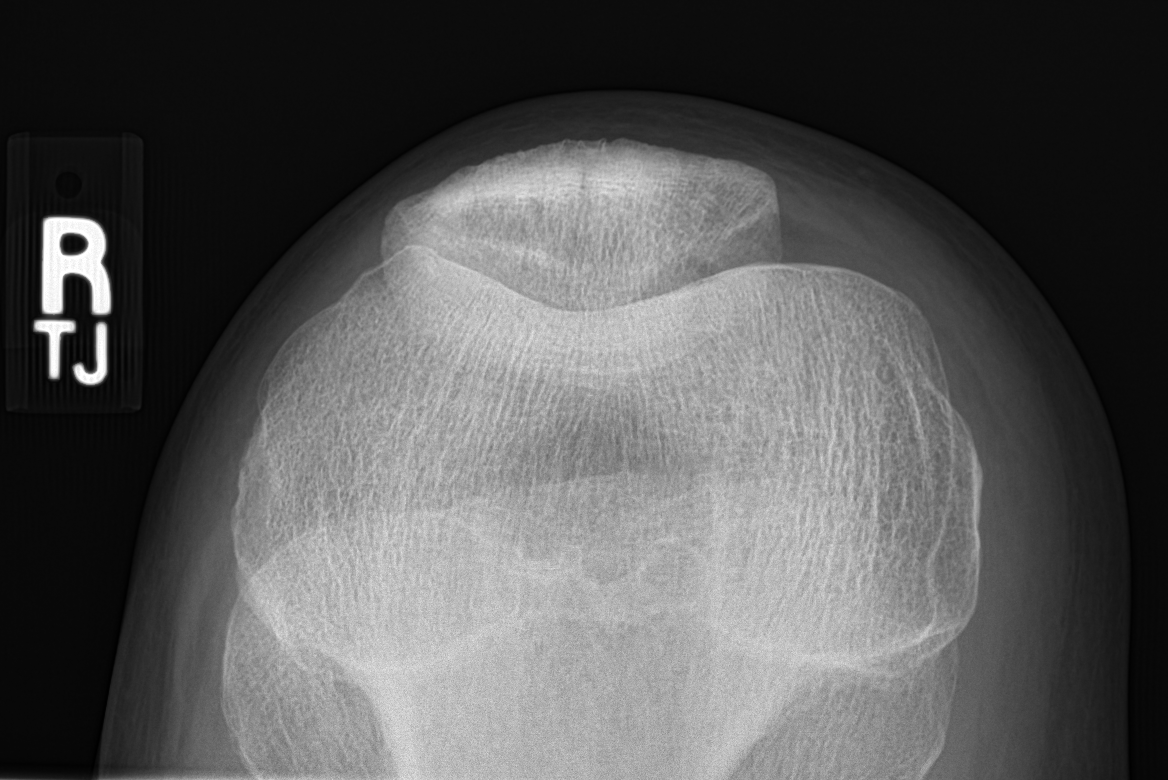

[4 of 4 positions shown; findings below may reference images not displayed]

FINDINGS: Mild chondrocalcinosis noted. This is most likely degenerative. No
acute bony abnormality identified. No evidence of fracture
dislocation. Tiny knee joint effusion cannot be excluded.
IMPRESSION: Mild chondrocalcinosis, most likely degenerative. No acute bony
abnormality identified. Tiny knee joint effusion cannot be excluded.

## 2022-06-22 ENCOUNTER — Ambulatory Visit (HOSPITAL_COMMUNITY)
Admission: EM | Admit: 2022-06-22 | Discharge: 2022-06-22 | Disposition: A | Discharge status: Home / Self Care | Payer: Commercial Managed Care - PPO | Attending: Internal Medicine | Admitting: Internal Medicine

## 2022-06-22 ENCOUNTER — Encounter (HOSPITAL_COMMUNITY): Payer: Self-pay | Admitting: *Deleted

## 2022-06-22 DIAGNOSIS — K611 Rectal abscess: Secondary | ICD-10-CM

## 2022-06-22 MED ORDER — DOXYCYCLINE HYCLATE 100 MG PO CAPS: 100.0000 mg | ORAL_CAPSULE | Freq: Two times a day (BID) | ORAL | 0 refills | Status: AC

## 2022-06-22 MED ORDER — LIDOCAINE HCL 2 % IJ SOLN
INTRAMUSCULAR | Status: AC
  Filled 2022-06-22: qty 20

## 2022-06-22 MED ORDER — LIDOCAINE HCL (PF) 2 % IJ SOLN
INTRAMUSCULAR | Status: AC
  Filled 2022-06-22: qty 5

## 2022-06-22 NOTE — Discharge Instructions (Addendum)
We drained your cyst today in the clinic and left open to continue to drain.  Continue to use warm compresses to the wound to further encourage drainage from the wound.  You may do this in the shower as well with gentle compresses to the area to allow more infected material to drain.    Take doxycycline antibiotic twice daily for the next 10 days to treat infection to the cyst.  Change your dressings twice daily as the wound heals.  Do not apply any ointments, lotions, or powders to the wound.  You may take Tylenol/ibuprofen as needed for pain once the numbing wears off.  If you notice any worsening signs of infection such as redness, swelling, fever, or worsening drainage, please return to urgent care for reevaluation.  Call the general surgeon on your paperwork to schedule an appointment for consultation to have the cyst surgically removed to prevent further abscesses.

## 2022-06-22 NOTE — ED Provider Notes (Signed)
[place]    CSN: [id number] Arrival date & time: 06/22/22  2353      History   Chief Complaint Chief Complaint  Patient presents with  . Abdominal Pain    HPI Ronnie Palmer is a 71 y.o. male.   Patient presents to urgent care for evaluation of right sided gluteal abscess that he noticed approximately 1 week ago. Abscess has grown in pain, redness, and size over the last week to the point where it is painful for patient to be in a seated position due to pressure. He has been taking ibuprofen for the pain without much relief. States he has experienced a similar abscess 2-3 years ago where he came to urgent care, had it drained, and was placed on antibiotics. Denies recent antibiotic use, drainage from the site, and fever/chills. Son is present with him in the clinic and providing interpretation due to language barrier. History provided by both patient and his son with patient's permission.    Abdominal Pain   History reviewed. No pertinent past medical history.  Patient Active Problem List   Diagnosis Date Noted  . Elevated lipoprotein(a) 08/02/2020  . Dry skin 08/02/2020    Past Surgical History:  Procedure Laterality Date  . NO PAST SURGERIES         Home Medications    Prior to Admission medications   Medication Sig Start Date End Date Taking? Authorizing Provider  atorvastatin (LIPITOR) 20 MG tablet Take 1 tablet (20 mg total) by mouth daily. 10/22/20   Jearld Fenton, NP  colchicine 0.6 MG tablet Take 1 tablet (0.6 mg total) by mouth 2 (two) times daily. 01/23/20   Owens Loffler, MD    Family History History reviewed. No pertinent family history.  Social History Social History   Tobacco Use  . Smoking status: Every Day    Packs/day: 0.25    Years: 40.00    Total pack years: 10.00    Types: Cigarettes  . Smokeless tobacco: Never  Vaping Use  . Vaping Use: Never used  Substance Use Topics  . Alcohol use: Yes    Comment:  occasional  . Drug use: No     Allergies   Patient has no known allergies.   Review of Systems Review of Systems  Gastrointestinal:  Positive for abdominal pain.  Per HPI   Physical Exam Triage Vital Signs ED Triage Vitals  Enc Vitals Group     BP 06/22/22 1027 (!) 180/89     Pulse Rate 06/22/22 1027 (!) 53     Resp 06/22/22 1027 18     Temp 06/22/22 1027 97.9 F (36.6 C)     Temp Source 06/22/22 1027 Oral     SpO2 06/22/22 1027 98 %     Weight --      Height --      Head Circumference --      Peak Flow --      Pain Score 06/22/22 1026 8     Pain Loc --      Pain Edu? --      Excl. in [place]? --    No data found.  Updated Vital Signs BP (!) 180/89 (BP Location: Left Arm)   Pulse (!) 53   Temp 97.9 F (36.6 C) (Oral)   Resp 18   SpO2 98%   Visual Acuity Right Eye Distance:   Left Eye Distance:   Bilateral Distance:    Right Eye Near:   Left Eye Near:  Bilateral Near:     Physical Exam   UC Treatments / Results  Labs (all labs ordered are listed, but only abnormal results are displayed) Labs Reviewed - No data to display  EKG   Radiology No results found.  Procedures Procedures (including critical care time)  Medications Ordered in UC Medications - No data to display  Initial Impression / Assessment and Plan / UC Course  I have reviewed the triage vital signs and the nursing notes.  Pertinent labs & imaging results that were available during my care of the patient were reviewed by me and considered in my medical decision making (see chart for details).     *** Final Clinical Impressions(s) / UC Diagnoses   Final diagnoses:  None   Discharge Instructions   None    ED Prescriptions   None    PDMP not reviewed this encounter.

## 2022-06-22 NOTE — ED Triage Notes (Signed)
Pt states that he has an abscess on his right buttock x 1 week. It is painful. He has one of these about a year ago. He is taking IBU for the pain.

## 2022-12-05 ENCOUNTER — Ambulatory Visit
Admission: EM | Admit: 2022-12-05 | Discharge: 2022-12-05 | Disposition: A | Discharge status: Home / Self Care | Payer: No Typology Code available for payment source | Attending: Emergency Medicine | Admitting: Emergency Medicine

## 2022-12-05 ENCOUNTER — Ambulatory Visit (INDEPENDENT_AMBULATORY_CARE_PROVIDER_SITE_OTHER): Payer: No Typology Code available for payment source

## 2022-12-05 DIAGNOSIS — R051 Acute cough: Secondary | ICD-10-CM

## 2022-12-05 DIAGNOSIS — J209 Acute bronchitis, unspecified: Secondary | ICD-10-CM

## 2022-12-05 DIAGNOSIS — R030 Elevated blood-pressure reading, without diagnosis of hypertension: Secondary | ICD-10-CM

## 2022-12-05 DIAGNOSIS — F17200 Nicotine dependence, unspecified, uncomplicated: Secondary | ICD-10-CM

## 2022-12-05 DIAGNOSIS — R059 Cough, unspecified: Secondary | ICD-10-CM | POA: Diagnosis not present

## 2022-12-05 MED ORDER — ALBUTEROL SULFATE HFA 108 (90 BASE) MCG/ACT IN AERS: 1.0000 | INHALATION_SPRAY | Freq: Four times a day (QID) | RESPIRATORY_TRACT | 0 refills | Status: DC | PRN

## 2022-12-05 MED ORDER — PREDNISONE 10 MG PO TABS: 40.0000 mg | ORAL_TABLET | Freq: Every day | ORAL | 0 refills | Status: AC

## 2022-12-05 MED ORDER — AZITHROMYCIN 250 MG PO TABS: 250.0000 mg | ORAL_TABLET | Freq: Every day | ORAL | 0 refills | Status: DC

## 2022-12-05 NOTE — Discharge Instructions (Addendum)
Use the albuterol inhaler as directed.  Take the prednisone and Zithromax as directed.  Follow up with your primary care provider tomorrow.  Your blood pressure is elevated today at 151/80; repeat 157/85.  Please have this rechecked by your primary care provider in 2-4 weeks.

## 2022-12-05 NOTE — ED Triage Notes (Signed)
Patient presents to UC for cough and SOB x 1.5 weeks. Taking nyquil.   Denies fever.

## 2022-12-05 NOTE — ED Provider Notes (Signed)
[facility]    CSN: [id number] Arrival date & time: 12/05/22  0825      History   Chief Complaint Chief Complaint  Patient presents with   Cough    HPI Ronnie Palmer is a 72 y.o. male.   Accompanied by his son, patient presents with cough and shortness of breath with coughing x 10 days.  Current everyday smoker.  He denies fever, chills, hemoptysis, chest pain, vomiting, diarrhea, or other symptoms.  Treatment attempted with Mucinex and Nyquil.  He denies pertinent medical history.    The history is provided by the patient and a relative.    History reviewed. No pertinent past medical history.  There are no problems to display for this patient.   History reviewed. No pertinent surgical history.     Home Medications    Prior to Admission medications   Medication Sig Start Date End Date Taking? Authorizing Provider  albuterol (VENTOLIN HFA) 108 (90 Base) MCG/ACT inhaler Inhale 1-2 puffs into the lungs every 6 (six) hours as needed. 12/05/22  Yes Mickie Bail, NP  azithromycin (ZITHROMAX) 250 MG tablet Take 1 tablet (250 mg total) by mouth daily. Take first 2 tablets together, then 1 every day until finished. 12/05/22  Yes Mickie Bail, NP  predniSONE (DELTASONE) 10 MG tablet Take 4 tablets (40 mg total) by mouth daily for 5 days. 12/05/22 12/10/22 Yes Mickie Bail, NP    Family History History reviewed. No pertinent family history.  Social History Social History   Tobacco Use   Smoking status: Every Day    Types: Cigarettes   Smokeless tobacco: Never  Vaping Use   Vaping Use: Never used  Substance Use Topics   Alcohol use: Yes    Comment: social drinker   Drug use: Never     Allergies   Patient has no known allergies.   Review of Systems Review of Systems  Constitutional:  Negative for chills and fever.  HENT:  Positive for congestion, postnasal drip and sore throat. Negative for ear pain.   Respiratory:  Positive for cough and shortness  of breath.   Cardiovascular:  Negative for chest pain and palpitations.  Gastrointestinal:  Negative for abdominal pain, diarrhea and vomiting.  Skin:  Negative for color change and rash.  All other systems reviewed and are negative.    Physical Exam Triage Vital Signs ED Triage Vitals  Enc Vitals Group     BP 12/05/22 0923 (!) 151/80     Pulse Rate 12/05/22 0923 65     Resp 12/05/22 0923 18     Temp 12/05/22 0923 97.8 F (36.6 C)     Temp src --      SpO2 12/05/22 0923 96 %     Weight --      Height --      Head Circumference --      Peak Flow --      Pain Score 12/05/22 0925 0     Pain Loc --      Pain Edu? --      Excl. in [place]? --    No data found.  Updated Vital Signs BP (!) 157/85 (BP Location: Left Arm)   Pulse 65   Temp 97.8 F (36.6 C)   Resp 18   SpO2 96%   Visual Acuity Right Eye Distance:   Left Eye Distance:   Bilateral Distance:    Right Eye Near:   Left Eye Near:  Bilateral Near:     Physical Exam Vitals and nursing note reviewed.  Constitutional:      General: He is not in acute distress.    Appearance: Normal appearance. He is well-developed. He is not ill-appearing.  HENT:     Right Ear: Tympanic membrane normal.     Left Ear: Tympanic membrane normal.     Nose: Rhinorrhea present.     Mouth/Throat:     Mouth: Mucous membranes are moist.     Pharynx: Oropharynx is clear.     Comments: Clear PND. Cardiovascular:     Rate and Rhythm: Normal rate and regular rhythm.     Heart sounds: Normal heart sounds.  Pulmonary:     Effort: Pulmonary effort is normal. No respiratory distress.     Breath sounds: Wheezing and rhonchi present.  Musculoskeletal:     Cervical back: Neck supple.  Skin:    General: Skin is warm and dry.  Neurological:     Mental Status: He is alert.  Psychiatric:        Mood and Affect: Mood normal.        Behavior: Behavior normal.      UC Treatments / Results  Labs (all labs ordered are listed, but only  abnormal results are displayed) Labs Reviewed - No data to display  EKG   Radiology DG Chest 2 View  Result Date: 12/05/2022 CLINICAL DATA:  72 year old male with history of cough and congestion. EXAM: CHEST - 2 VIEW COMPARISON:  No priors. FINDINGS: Lung volumes are normal. No consolidative airspace disease. No pleural effusions. No pneumothorax. No pulmonary nodule or mass noted. Pulmonary vasculature and the cardiomediastinal silhouette are within normal limits. IMPRESSION: No radiographic evidence of acute cardiopulmonary disease. Electronically Signed   By: Trudie Reed M.D.   On: 12/05/2022 10:08    Procedures Procedures (including critical care time)  Medications Ordered in UC Medications - No data to display  Initial Impression / Assessment and Plan / UC Course  I have reviewed the triage vital signs and the nursing notes.  Pertinent labs & imaging results that were available during my care of the patient were reviewed by me and considered in my medical decision making (see chart for details).   Cough, current everyday smoker, elevated blood pressure reading, acute bronchitis.  Chest x-ray negative.  Treating with albuterol inhaler, prednisone, Zithromax.  Instructed patient to follow-up with his PCP tomorrow.  Also discussed that his blood pressure is elevated today and needs to be rechecked.  Education provided on acute bronchitis, preventing hypertension, health risks of smoking.  Patient and his son agree to plan of care.   Final Clinical Impressions(s) / UC Diagnoses   Final diagnoses:  Acute cough  Smoker  Elevated blood pressure reading  Acute bronchitis, unspecified organism     Discharge Instructions      Use the albuterol inhaler as directed.  Take the prednisone and Zithromax as directed.  Follow up with your primary care provider tomorrow.  Your blood pressure is elevated today at 151/80; repeat 157/85.  Please have this rechecked by your primary care  provider in 2-4 weeks.          ED Prescriptions     Medication Sig Dispense Auth. Provider   albuterol (VENTOLIN HFA) 108 (90 Base) MCG/ACT inhaler Inhale 1-2 puffs into the lungs every 6 (six) hours as needed. 18 g Mickie Bail, NP   predniSONE (DELTASONE) 10 MG tablet Take 4 tablets (40 mg  total) by mouth daily for 5 days. 20 tablet Mickie Bailate, Gwynneth Fabio H, NP   azithromycin (ZITHROMAX) 250 MG tablet Take 1 tablet (250 mg total) by mouth daily. Take first 2 tablets together, then 1 every day until finished. 6 tablet Mickie Bailate, Lichelle Viets H, NP      PDMP not reviewed this encounter.   Mickie Bailate, Jakota Manthei H, NP 12/05/22 1020

## 2022-12-06 ENCOUNTER — Encounter (HOSPITAL_COMMUNITY): Payer: Self-pay | Admitting: *Deleted

## 2022-12-17 ENCOUNTER — Ambulatory Visit (INDEPENDENT_AMBULATORY_CARE_PROVIDER_SITE_OTHER)
Admission: RE | Admit: 2022-12-17 | Discharge: 2022-12-17 | Disposition: A | Discharge status: Home / Self Care | Payer: No Typology Code available for payment source | Source: Ambulatory Visit | Attending: Family Medicine | Admitting: Family Medicine

## 2022-12-17 ENCOUNTER — Ambulatory Visit (INDEPENDENT_AMBULATORY_CARE_PROVIDER_SITE_OTHER): Payer: No Typology Code available for payment source | Admitting: Family Medicine

## 2022-12-17 ENCOUNTER — Encounter: Payer: Self-pay | Admitting: Family Medicine

## 2022-12-17 VITALS — BP 138/80 | HR 60 | Temp 97.7°F | Ht 68.0 in | Wt 165.0 lb

## 2022-12-17 DIAGNOSIS — J0100 Acute maxillary sinusitis, unspecified: Secondary | ICD-10-CM | POA: Diagnosis not present

## 2022-12-17 DIAGNOSIS — J209 Acute bronchitis, unspecified: Secondary | ICD-10-CM

## 2022-12-17 DIAGNOSIS — J019 Acute sinusitis, unspecified: Secondary | ICD-10-CM | POA: Insufficient documentation

## 2022-12-17 DIAGNOSIS — F17200 Nicotine dependence, unspecified, uncomplicated: Secondary | ICD-10-CM

## 2022-12-17 DIAGNOSIS — R059 Cough, unspecified: Secondary | ICD-10-CM | POA: Diagnosis not present

## 2022-12-17 DIAGNOSIS — R062 Wheezing: Secondary | ICD-10-CM | POA: Diagnosis not present

## 2022-12-17 MED ORDER — PREDNISONE 20 MG PO TABS: ORAL_TABLET | ORAL | 0 refills | Status: DC

## 2022-12-17 MED ORDER — ALBUTEROL SULFATE HFA 108 (90 BASE) MCG/ACT IN AERS: 1.0000 | INHALATION_SPRAY | RESPIRATORY_TRACT | 3 refills | Status: DC | PRN

## 2022-12-17 MED ORDER — AMOXICILLIN-POT CLAVULANATE 875-125 MG PO TABS: 1.0000 | ORAL_TABLET | Freq: Two times a day (BID) | ORAL | 0 refills | Status: DC

## 2022-12-17 NOTE — Progress Notes (Unsigned)
Subjective:    Patient ID: Ronnie Palmer, male    DOB: May 24, 1951, 72 y.o.   MRN: [id number]  HPI 72 yo pt of NP Leone Payor presents for c/o cough  Son interprets for him today   She has appt to est with NP Cable in late June  Wt Readings from Last 3 Encounters:  12/17/22 165 lb (74.8 kg)  07/30/20 170 lb (77.1 kg)  01/23/20 170 lb 8 oz (77.3 kg)   25.09 kg/m  Vitals:   12/17/22 1031  BP: 138/80  Pulse: 60  Temp: 97.7 F (36.5 C)  SpO2: 93%      Was in ER about 2 wk ago  Is an every day smoker   (has not smoked since he got sick)   Did a cxr DG Chest 2 View  Result Date: 12/05/2022 CLINICAL DATA:  72 year old male with history of cough and congestion. EXAM: CHEST - 2 VIEW COMPARISON:  No priors. FINDINGS: Lung volumes are normal. No consolidative airspace disease. No pleural effusions. No pneumothorax. No pulmonary nodule or mass noted. Pulmonary vasculature and the cardiomediastinal silhouette are within normal limits. IMPRESSION: No radiographic evidence of acute cardiopulmonary disease. Electronically Signed   By: Trudie Reed M.D.   On: 12/05/2022 10:08   She was tx for acute bronchitis with albuterol, prednisone 40 mg daily for 5 d  and zithromax  Noted bp was up at that time also    Thought he was getting better  Then worse again since the weekend  Sob Constant wheezing  Cough : little mucous/ not much   little yellow  Sinus congestion : nasal mucous is also yellow  Worse sinus pain and pressure- whole face is sore  Worse when lying down . Trying to sleep propped or recliner No fever   Lots of pollen exposure  No historical problem with it   Otc Musinex DM  Aleve  No nasal sprays   DG Chest 2 View  Result Date: 12/17/2022 CLINICAL DATA:  Ongoing cough after bronchitis in smoker, with wheezing. EXAM: CHEST - 2 VIEW COMPARISON:  Radiographs 12/05/2022. FINDINGS: The heart size and mediastinal contours are stable. There is probable central  airway thickening, but no significant hyperinflation, focal airspace disease, edema, pleural effusion or pneumothorax. The bones appear unchanged. IMPRESSION: Probable central airway thickening consistent with bronchitis. No evidence of pneumonia. Electronically Signed   By: Carey Bullocks M.D.   On: 12/17/2022 11:33   DG Chest 2 View  Result Date: 12/05/2022 CLINICAL DATA:  72 year old male with history of cough and congestion. EXAM: CHEST - 2 VIEW COMPARISON:  No priors. FINDINGS: Lung volumes are normal. No consolidative airspace disease. No pleural effusions. No pneumothorax. No pulmonary nodule or mass noted. Pulmonary vasculature and the cardiomediastinal silhouette are within normal limits. IMPRESSION: No radiographic evidence of acute cardiopulmonary disease. Electronically Signed   By: Trudie Reed M.D.   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 Past Surgical History:  Procedure Laterality Date   NO PAST SURGERIES     Social History   Tobacco Use   Smoking status: Every Day    Packs/day: 0.25    Years: 40.00    Additional pack years: 0.00    Total pack years: 10.00  Types: Cigarettes   Smokeless tobacco: Never   Tobacco comments:    Has not smoked since sick  Vaping Use   Vaping Use: Never used  Substance Use Topics   Alcohol use: Yes    Comment: social drinker   Drug use: Never   No family history on file. No Known Allergies Current Outpatient Medications on File Prior to Visit  Medication Sig Dispense Refill   atorvastatin (LIPITOR) 20 MG tablet Take 1 tablet (20 mg total) by mouth daily. (Patient not taking: Reported on 12/17/2022) 90 tablet 0   No current facility-administered medications on file prior to visit.     Review of Systems   Constitutional:  Positive for appetite change. Negative for fatigue and fever.  HENT:  Positive for congestion, ear pain, postnasal drip, rhinorrhea, sinus pressure and sore throat. Negative for nosebleeds.   Eyes:  Negative for pain, redness and itching.  Respiratory:  Positive for cough, chest tightness, shortness of breath and wheezing. Negative for choking and stridor.   Cardiovascular:  Negative for chest pain.  Gastrointestinal:  Negative for abdominal pain, diarrhea, nausea and vomiting.  Endocrine: Negative for polyuria.  Genitourinary:  Negative for dysuria, frequency and urgency.  Musculoskeletal:  Negative for arthralgias and myalgias.  Allergic/Immunologic: Negative for immunocompromised state.  Neurological:  Positive for headaches. Negative for dizziness, tremors, syncope, weakness and numbness.  Hematological:  Negative for adenopathy. Does not bruise/bleed easily.  Psychiatric/Behavioral:  Negative for dysphoric mood. The patient is not nervous/anxious.        Objective:   Physical Exam Constitutional:      General: He is not in acute distress.    Appearance: Normal appearance. He is well-developed and normal weight. He is not ill-appearing or diaphoretic.  HENT:     Head: Normocephalic and atraumatic.     Comments: Bilateral maxillary and frontal sinus tenderness    Right Ear: Tympanic membrane, ear canal and external ear normal.     Left Ear: Tympanic membrane, ear canal and external ear normal.     Nose: Congestion and rhinorrhea present.     Mouth/Throat:     Pharynx: Oropharynx is clear. No oropharyngeal exudate or posterior oropharyngeal erythema.     Comments: Clear pnd Eyes:     General:        Right eye: No discharge.        Left eye: No discharge.     Conjunctiva/sclera: Conjunctivae normal.     Pupils: Pupils are equal, round, and reactive to light.  Cardiovascular:     Rate and Rhythm: Normal rate and regular rhythm.  Pulmonary:     Effort:  Pulmonary effort is normal. No respiratory distress.     Breath sounds: Normal breath sounds. No wheezing or rales.     Comments: Diffusely distant bs  Scattered rhonchi  Exp wheezes   Air exch is fair  No prolonged exp phase Musculoskeletal:     Cervical back: Normal range of motion and neck supple.  Lymphadenopathy:     Cervical: No cervical adenopathy.  Skin:    General: Skin is warm and dry.     Findings: No rash.  Neurological:     Mental Status: He is alert.     Cranial Nerves: No cranial nerve deficit.     Coordination: Coordination normal.  Psychiatric:        Mood and Affect: Mood normal.           Assessment & Plan:   Problem List  Items Addressed This Visit       Respiratory   Acute bronchitis - Primary    Reviewed his ER notes from 2 wk ago with cxr  Smoker/not interested in quitting  He improved with prednisone 40 for 5 d and z pack  then symptoms worsened  ? If he is possibly starting some copd  Cxr today showed no infiltrate   Wheezing on exam , pulse ox of 93%  Px prednisone 60 mg taper Albuterol   Augmentin for bacterial sinusitis       Relevant Orders   DG Chest 2 View (Completed)   Acute sinusitis    S/p several weeks of uri purulent nasal mucous / facial pain   Px augmentin  Disc symptom control-see AVS  Disc ER precautions   Also bronchitis- prednisone 60 mg taper px willl also help congestion  Enc use of nasal saline spray       Relevant Medications   predniSONE (DELTASONE) 20 MG tablet   amoxicillin-clavulanate (AUGMENTIN) 875-125 MG tablet     Other   Smoker    Ready to quit: No Counseling given: Yes Tobacco comments: Has not smoked since sick   Disc in detail risks of smoking and possible outcomes including copd, vascular/ heart disease, cancer , respiratory and sinus infections  Pt voices understanding Wonder if he is getting copd  He is not interested in quitting but voiced understanding       Relevant Orders    DG Chest 2 View (Completed)

## 2022-12-17 NOTE — Patient Instructions (Addendum)
Drink fluids and rest  mucinex DM is good for cough and congestion  Nasal saline for congestion as needed  Tylenol for fever or pain or headache (aleve is ok if taken with food)  Please alert [place] if symptoms worsen (if severe or short of breath please go to the ER)   Chest xray today  We will call you with results   Take the prednisone taper as directed  Take the augmentin as directed    Strongly consider quitting smoking

## 2022-12-19 NOTE — Assessment & Plan Note (Signed)
Reviewed his ER notes from 2 wk ago with cxr  Smoker/not interested in quitting  He improved with prednisone 40 for 5 d and z pack  then symptoms worsened  ? If he is possibly starting some copd  Cxr today showed no infiltrate   Wheezing on exam , pulse ox of 93%  Px prednisone 60 mg taper Albuterol   Augmentin for bacterial sinusitis

## 2022-12-19 NOTE — Assessment & Plan Note (Signed)
Ready to quit: No Counseling given: Yes Tobacco comments: Has not smoked since sick   Disc in detail risks of smoking and possible outcomes including copd, vascular/ heart disease, cancer , respiratory and sinus infections  Pt voices understanding Wonder if he is getting copd  He is not interested in quitting but voiced understanding

## 2022-12-19 NOTE — Assessment & Plan Note (Signed)
S/p several weeks of uri purulent nasal mucous / facial pain   Px augmentin  Disc symptom control-see AVS  Disc ER precautions   Also bronchitis- prednisone 60 mg taper px willl also help congestion  Enc use of nasal saline spray

## 2023-01-05 ENCOUNTER — Encounter: Payer: Self-pay | Admitting: Family Medicine

## 2023-01-05 ENCOUNTER — Ambulatory Visit (INDEPENDENT_AMBULATORY_CARE_PROVIDER_SITE_OTHER): Payer: No Typology Code available for payment source | Admitting: Family Medicine

## 2023-01-05 VITALS — BP 124/78 | HR 56 | Temp 98.1°F | Ht 68.0 in | Wt 160.5 lb

## 2023-01-05 DIAGNOSIS — F17200 Nicotine dependence, unspecified, uncomplicated: Secondary | ICD-10-CM | POA: Diagnosis not present

## 2023-01-05 DIAGNOSIS — R052 Subacute cough: Secondary | ICD-10-CM

## 2023-01-05 DIAGNOSIS — J410 Simple chronic bronchitis: Secondary | ICD-10-CM

## 2023-01-05 MED ORDER — LEVOFLOXACIN 500 MG PO TABS: 500.0000 mg | ORAL_TABLET | Freq: Every day | ORAL | 0 refills | Status: DC

## 2023-01-05 MED ORDER — PREDNISONE 20 MG PO TABS: ORAL_TABLET | ORAL | 0 refills | Status: DC

## 2023-01-05 NOTE — Progress Notes (Signed)
[name], MD, CAQ Sports Medicine [facility] [address] [place], [address]  Phone: [phone]  FAX: [phone]  Ronnie Palmer - 72 y.o. male  MRN [id number]  Date of Birth: 12-Nov-1950  Date: 01/05/2023  PCP: Emi Belfast, FNP  Referral: Emi Belfast, FNP  Chief Complaint  Patient presents with   Cough   Shortness of Breath    Since April-Seen at Urgent Care and Dr. Milinda Antis    Subjective:   Ronnie Palmer is a 72 y.o. very pleasant male patient with Body mass index is 24.4 kg/m. who presents with the following:  Patient presents for follow-up after a April appointment with my partner Dr. Milinda Antis for bronchitis and possible sinusitis.  At that point, he was treated for bronchitis and sinusitis with Augmentin, prednisone, and albuterol.  He continues to have daily coughing, which is bothering him quite a bit.  His son is here with him who provides interpretive services.  He is [name].  Coughing and some nights are really bad No rest at all Since April and he has never gotten better.    Does not hurt or have any pain, and the coughing is keeping him up at night.  Multiple nights where he will not rest at all.  Back and side lying on will really hurt and be bad. He has some shortness of breath  2-3 weeks not smoking.  No fever  Sinus congestion on the frontal Feels with coughing and wheezing.   Review of Systems is noted in the HPI, as appropriate  Objective:   BP 124/78 (BP Location: Left Arm, Patient Position: Sitting, Cuff Size: Normal)   Pulse (!) 56   Temp 98.1 F (36.7 C) (Temporal)   Ht 5\' 8"  (1.727 m)   Wt 160 lb 8 oz (72.8 kg)   SpO2 97%   BMI 24.40 kg/m    Gen: WDWN, NAD. Globally Non-toxic HEENT: Throat clear, w/o exudate, R TM clear, L TM - good landmarks, No fluid present. rhinnorhea.  MMM Frontal sinuses: NT Max sinuses: NT NECK: Anterior cervical  LAD is  absent CV: RRR, No M/G/R, cap refill <2 sec PULM: Breathing comfortably in no respiratory distress.  Scattered, relatively diffuse wheezing.  Laboratory and Imaging Data: DG Chest 2 View  Result Date: 12/17/2022 CLINICAL DATA:  Ongoing cough after bronchitis in smoker, with wheezing. EXAM: CHEST - 2 VIEW COMPARISON:  Radiographs 12/05/2022. FINDINGS: The heart size and mediastinal contours are stable. There is probable central airway thickening, but no significant hyperinflation, focal airspace disease, edema, pleural effusion or pneumothorax. The bones appear unchanged. IMPRESSION: Probable central airway thickening consistent with bronchitis. No evidence of pneumonia. Electronically Signed   By: Carey Bullocks M.D.   On: 12/17/2022 11:33     Assessment and Plan:     ICD-10-CM   1. 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 Smoker  F17.200     3. Subacute cough  R05.2      Okay to place the patient on Levaquin for broad-spectrum pulmonary coverage.  10 days of oral prednisone, and he is also going to use his albuterol inhaler as needed.  Some of this may be due to some underlying lung dysfunction and a long-term smoker.  When he is healthy, spirometry would be a good idea.  Medication Management during today's office visit: Meds ordered this encounter  Medications   levofloxacin (LEVAQUIN) 500 MG tablet  Sig: Take 1 tablet (500 mg total) by mouth daily.    Dispense:  7 tablet    Refill:  0   predniSONE (DELTASONE) 20 MG tablet    Sig: 2 tabs po daily for 5 days, then 1 tab po daily for 5 days    Dispense:  15 tablet    Refill:  0   Medications Discontinued During This Encounter  Medication Reason   amoxicillin-clavulanate (AUGMENTIN) 875-125 MG tablet Completed Course   predniSONE (DELTASONE) 20 MG tablet Completed Course    Orders placed today for conditions managed today: No orders of the defined types were placed in this encounter.   Disposition: No follow-ups  on file.  [employer] One speech-to-text software was used for transcription in this dictation.  Possible transcriptional errors can occur using Animal nutritionist.   Signed,  Elpidio Galea. Rochel Privett, MD   Outpatient Encounter Medications as of 01/05/2023  Medication Sig   albuterol (VENTOLIN HFA) 108 (90 Base) MCG/ACT inhaler Inhale 1-2 puffs into the lungs every 4 (four) hours as needed for wheezing or shortness of breath.   atorvastatin (LIPITOR) 20 MG tablet Take 1 tablet (20 mg total) by mouth daily.   levofloxacin (LEVAQUIN) 500 MG tablet Take 1 tablet (500 mg total) by mouth daily.   predniSONE (DELTASONE) 20 MG tablet 2 tabs po daily for 5 days, then 1 tab po daily for 5 days   [DISCONTINUED] amoxicillin-clavulanate (AUGMENTIN) 875-125 MG tablet Take 1 tablet by mouth 2 (two) times daily. With food   [DISCONTINUED] predniSONE (DELTASONE) 20 MG tablet Take 3 pills once daily by mouth for 3 days, then 2 pills once daily for 3 days, then 1 pill once daily for 3 days and then stop   No facility-administered encounter medications on file as of 01/05/2023.

## 2023-01-25 ENCOUNTER — Other Ambulatory Visit: Payer: Self-pay | Admitting: Family Medicine

## 2023-01-25 NOTE — Telephone Encounter (Signed)
Patient is requesting refills.   Seen on 01/05/2023 for bronchitis.

## 2023-02-03 ENCOUNTER — Observation Stay (HOSPITAL_COMMUNITY): Payer: No Typology Code available for payment source

## 2023-02-03 ENCOUNTER — Encounter (HOSPITAL_COMMUNITY): Payer: Self-pay

## 2023-02-03 ENCOUNTER — Inpatient Hospital Stay (HOSPITAL_COMMUNITY)
Admission: EM | Admit: 2023-02-03 | Discharge: 2023-02-05 | DRG: 190 | Disposition: A | Discharge status: Home / Self Care | Payer: No Typology Code available for payment source | Attending: Family Medicine | Admitting: Family Medicine

## 2023-02-03 ENCOUNTER — Other Ambulatory Visit: Payer: Self-pay

## 2023-02-03 ENCOUNTER — Emergency Department (HOSPITAL_COMMUNITY): Payer: No Typology Code available for payment source

## 2023-02-03 DIAGNOSIS — I10 Essential (primary) hypertension: Secondary | ICD-10-CM | POA: Diagnosis not present

## 2023-02-03 DIAGNOSIS — J441 Chronic obstructive pulmonary disease with (acute) exacerbation: Secondary | ICD-10-CM | POA: Diagnosis not present

## 2023-02-03 DIAGNOSIS — J9601 Acute respiratory failure with hypoxia: Secondary | ICD-10-CM | POA: Diagnosis present

## 2023-02-03 DIAGNOSIS — J384 Edema of larynx: Secondary | ICD-10-CM | POA: Diagnosis not present

## 2023-02-03 DIAGNOSIS — R0602 Shortness of breath: Secondary | ICD-10-CM | POA: Diagnosis not present

## 2023-02-03 DIAGNOSIS — I480 Paroxysmal atrial fibrillation: Secondary | ICD-10-CM | POA: Diagnosis present

## 2023-02-03 DIAGNOSIS — I4891 Unspecified atrial fibrillation: Secondary | ICD-10-CM | POA: Diagnosis not present

## 2023-02-03 DIAGNOSIS — E7841 Elevated Lipoprotein(a): Secondary | ICD-10-CM | POA: Diagnosis not present

## 2023-02-03 DIAGNOSIS — Z79899 Other long term (current) drug therapy: Secondary | ICD-10-CM

## 2023-02-03 DIAGNOSIS — R0689 Other abnormalities of breathing: Secondary | ICD-10-CM | POA: Diagnosis not present

## 2023-02-03 DIAGNOSIS — K219 Gastro-esophageal reflux disease without esophagitis: Secondary | ICD-10-CM | POA: Diagnosis present

## 2023-02-03 DIAGNOSIS — I700 Atherosclerosis of aorta: Secondary | ICD-10-CM | POA: Diagnosis not present

## 2023-02-03 DIAGNOSIS — J45901 Unspecified asthma with (acute) exacerbation: Secondary | ICD-10-CM | POA: Diagnosis present

## 2023-02-03 DIAGNOSIS — F17210 Nicotine dependence, cigarettes, uncomplicated: Secondary | ICD-10-CM | POA: Diagnosis present

## 2023-02-03 DIAGNOSIS — R062 Wheezing: Secondary | ICD-10-CM | POA: Diagnosis not present

## 2023-02-03 LAB — CBC WITH DIFFERENTIAL/PLATELET
Abs Immature Granulocytes: 0.07 10*3/uL (ref 0.00–0.07)
Basophils Absolute: 0.1 10*3/uL (ref 0.0–0.1)
Basophils Relative: 1 %
Eosinophils Absolute: 0.3 10*3/uL (ref 0.0–0.5)
Eosinophils Relative: 3 %
HCT: 43.1 % (ref 39.0–52.0)
Hemoglobin: 13.5 g/dL (ref 13.0–17.0)
Immature Granulocytes: 1 %
Lymphocytes Relative: 17 %
Lymphs Abs: 1.5 10*3/uL (ref 0.7–4.0)
MCH: 27.1 pg (ref 26.0–34.0)
MCHC: 31.3 g/dL (ref 30.0–36.0)
MCV: 86.5 fL (ref 80.0–100.0)
Monocytes Absolute: 0.3 10*3/uL (ref 0.1–1.0)
Monocytes Relative: 3 %
Neutro Abs: 6.6 10*3/uL (ref 1.7–7.7)
Neutrophils Relative %: 75 %
Platelets: 284 10*3/uL (ref 150–400)
RBC: 4.98 MIL/uL (ref 4.22–5.81)
RDW: 13.8 % (ref 11.5–15.5)
WBC: 8.8 10*3/uL (ref 4.0–10.5)
nRBC: 0 % (ref 0.0–0.2)

## 2023-02-03 LAB — BASIC METABOLIC PANEL
Anion gap: 11 (ref 5–15)
BUN: 28 mg/dL — ABNORMAL HIGH (ref 8–23)
CO2: 23 mmol/L (ref 22–32)
Calcium: 8.7 mg/dL — ABNORMAL LOW (ref 8.9–10.3)
Chloride: 104 mmol/L (ref 98–111)
Creatinine, Ser: 0.93 mg/dL (ref 0.61–1.24)
GFR, Estimated: >60 mL/min (ref >60)
Glucose, Bld: 113 mg/dL — ABNORMAL HIGH (ref 70–99)
Potassium: 3.5 mmol/L (ref 3.5–5.1)
Sodium: 138 mmol/L (ref 135–145)

## 2023-02-03 LAB — BRAIN NATRIURETIC PEPTIDE: B Natriuretic Peptide: 76.2 pg/mL (ref 0.0–100.0)

## 2023-02-03 LAB — I-STAT VENOUS BLOOD GAS, ED
Acid-Base Excess: 4 mmol/L — ABNORMAL HIGH (ref 0.0–2.0)
Bicarbonate: 29.3 mmol/L — ABNORMAL HIGH (ref 20.0–28.0)
Calcium, Ion: 1.11 mmol/L — ABNORMAL LOW (ref 1.15–1.40)
HCT: 43 % (ref 39.0–52.0)
Hemoglobin: 14.6 g/dL (ref 13.0–17.0)
O2 Saturation: 88 %
Potassium: 3.6 mmol/L (ref 3.5–5.1)
Sodium: 141 mmol/L (ref 135–145)
TCO2: 31 mmol/L (ref 22–32)
pCO2, Ven: 44.3 mmHg (ref 44–60)
pH, Ven: 7.427 (ref 7.25–7.43)
pO2, Ven: 53 mmHg — ABNORMAL HIGH (ref 32–45)

## 2023-02-03 LAB — HIV ANTIBODY (ROUTINE TESTING W REFLEX): HIV Screen 4th Generation wRfx: NONREACTIVE

## 2023-02-03 LAB — TROPONIN I (HIGH SENSITIVITY): Troponin I (High Sensitivity): 7 ng/L (ref <18)

## 2023-02-03 MED ORDER — ENOXAPARIN SODIUM 40 MG/0.4ML IJ SOSY
40.0000 mg | PREFILLED_SYRINGE | INTRAMUSCULAR | Status: DC
  Administered 2023-02-04: 40 mg via SUBCUTANEOUS
  Filled 2023-02-03: qty 0.4

## 2023-02-03 MED ORDER — ALBUTEROL SULFATE (2.5 MG/3ML) 0.083% IN NEBU: 2.5000 mg | INHALATION_SOLUTION | RESPIRATORY_TRACT | Status: DC | PRN

## 2023-02-03 MED ORDER — PREDNISONE 20 MG PO TABS
50.0000 mg | ORAL_TABLET | Freq: Every day | ORAL | Status: DC
  Administered 2023-02-04: 50 mg via ORAL
  Filled 2023-02-03: qty 1

## 2023-02-03 MED ORDER — ACETAMINOPHEN 325 MG PO TABS: 650.0000 mg | ORAL_TABLET | Freq: Four times a day (QID) | ORAL | Status: DC | PRN

## 2023-02-03 MED ORDER — POLYETHYLENE GLYCOL 3350 17 G PO PACK: 17.0000 g | PACK | Freq: Every day | ORAL | Status: DC | PRN

## 2023-02-03 MED ORDER — IOHEXOL 350 MG/ML SOLN
135.0000 mL | Freq: Once | INTRAVENOUS | Status: AC | PRN
  Administered 2023-02-03: 135 mL via INTRAVENOUS

## 2023-02-03 MED ORDER — IPRATROPIUM BROMIDE 0.02 % IN SOLN
0.5000 mg | Freq: Four times a day (QID) | RESPIRATORY_TRACT | Status: DC
  Administered 2023-02-03 – 2023-02-04 (×3): 0.5 mg via RESPIRATORY_TRACT
  Filled 2023-02-03 (×3): qty 2.5

## 2023-02-03 MED ORDER — METOPROLOL TARTRATE 12.5 MG HALF TABLET: 12.5000 mg | ORAL_TABLET | ORAL | Status: DC

## 2023-02-03 MED ORDER — ALBUTEROL SULFATE (2.5 MG/3ML) 0.083% IN NEBU
INHALATION_SOLUTION | RESPIRATORY_TRACT | Status: AC
  Administered 2023-02-03: 15 mg/h via RESPIRATORY_TRACT
  Filled 2023-02-03: qty 18

## 2023-02-03 MED ORDER — SODIUM CHLORIDE 0.9% FLUSH
3.0000 mL | Freq: Two times a day (BID) | INTRAVENOUS | Status: DC
  Administered 2023-02-03 – 2023-02-05 (×3): 3 mL via INTRAVENOUS

## 2023-02-03 MED ORDER — METHYLPREDNISOLONE SODIUM SUCC 125 MG IJ SOLR
125.0000 mg | Freq: Once | INTRAMUSCULAR | Status: AC
  Administered 2023-02-03: 125 mg via INTRAVENOUS
  Filled 2023-02-03: qty 2

## 2023-02-03 MED ORDER — METOPROLOL TARTRATE 12.5 MG HALF TABLET
12.5000 mg | ORAL_TABLET | Freq: Two times a day (BID) | ORAL | Status: DC
  Administered 2023-02-03 – 2023-02-04 (×2): 12.5 mg via ORAL
  Filled 2023-02-03 (×2): qty 1

## 2023-02-03 MED ORDER — FUROSEMIDE 10 MG/ML IJ SOLN
20.0000 mg | Freq: Once | INTRAMUSCULAR | Status: AC
  Administered 2023-02-03: 20 mg via INTRAVENOUS
  Filled 2023-02-03: qty 2

## 2023-02-03 MED ORDER — ACETAMINOPHEN 650 MG RE SUPP: 650.0000 mg | Freq: Four times a day (QID) | RECTAL | Status: DC | PRN

## 2023-02-03 MED ORDER — ALBUTEROL SULFATE (2.5 MG/3ML) 0.083% IN NEBU: 15.0000 mg/h | INHALATION_SOLUTION | Freq: Once | RESPIRATORY_TRACT | Status: AC

## 2023-02-03 MED ORDER — MAGNESIUM SULFATE 2 GM/50ML IV SOLN
2.0000 g | Freq: Once | INTRAVENOUS | Status: AC
  Administered 2023-02-03: 2 g via INTRAVENOUS
  Filled 2023-02-03: qty 50

## 2023-02-03 NOTE — Progress Notes (Signed)
Pt was taken off the BIPAP at this time and placed on 3L [place]. Pt is tolerating well, no distress noted, vitals are stable.

## 2023-02-03 NOTE — ED Provider Notes (Signed)
[place] EMERGENCY DEPARTMENT AT [facility] Provider Note   CSN: [id number] Arrival date & time: 02/03/23  1305     History  Chief Complaint  Patient presents with   Respiratory Distress    Ronnie Palmer is a 72 y.o. male.  72 yo M with a chief complaints of recurrent difficulties breathing.  This been going on for about 6 to 8 weeks.  He has been seen multiple times in an urgent care setting and was told he likely has bronchitis.  Symptoms do transiently get better with beta agonist therapy and steroids and then tend to recur.  No history of asthma or COPD per patient or family.  No fevers.  He had progressively worsening symptoms over the past 48 hours.  When EMS arrived the patient was in respiratory distress with retractions and tachypnea.  Was placed on CPAP transition to BiPAP on arrival here.        Home Medications Prior to Admission medications   Medication Sig Start Date End Date Taking? Authorizing Provider  albuterol (VENTOLIN HFA) 108 (90 Base) MCG/ACT inhaler Inhale 1-2 puffs into the lungs every 4 (four) hours as needed for wheezing or shortness of breath. 12/17/22   Tower, Audrie Gallus, MD  atorvastatin (LIPITOR) 20 MG tablet Take 1 tablet (20 mg total) by mouth daily. 10/22/20   Lorre Munroe, NP      Allergies    Patient has no known allergies.    Review of Systems   Review of Systems  Physical Exam Updated Vital Signs BP (!) 148/73   Pulse 89   Temp 97.9 F (36.6 C) (Oral)   Resp 16   Ht 5\' 7"  (1.702 m)   Wt 72.8 kg   SpO2 99%   BMI 25.14 kg/m  Physical Exam Vitals and nursing note reviewed.  Constitutional:      Appearance: He is well-developed.  HENT:     Head: Normocephalic and atraumatic.  Eyes:     Pupils: Pupils are equal, round, and reactive to light.  Neck:     Vascular: No JVD.  Cardiovascular:     Rate and Rhythm: Normal rate and regular rhythm.     Heart sounds: No murmur heard.    No friction rub. No gallop.   Pulmonary:     Effort: No respiratory distress.     Breath sounds: Wheezing present.     Comments: Diffuse wheezes with prolonged expiratory effort Abdominal:     General: There is no distension.     Tenderness: There is no abdominal tenderness. There is no guarding or rebound.  Musculoskeletal:        General: Normal range of motion.     Cervical back: Normal range of motion and neck supple.  Skin:    Coloration: Skin is not pale.     Findings: No rash.  Neurological:     Mental Status: He is alert and oriented to person, place, and time.  Psychiatric:        Behavior: Behavior normal.     ED Results / Procedures / Treatments   Labs (all labs ordered are listed, but only abnormal results are displayed) Labs Reviewed  BASIC METABOLIC PANEL - Abnormal; Notable for the following components:      Result Value   Glucose, Bld 113 (*)    BUN 28 (*)    Calcium 8.7 (*)    All other components within normal limits  I-STAT VENOUS BLOOD GAS, ED - Abnormal;  Notable for the following components:   pO2, Ven 53 (*)    Bicarbonate 29.3 (*)    Acid-Base Excess 4.0 (*)    Calcium, Ion 1.11 (*)    All other components within normal limits  CBC WITH DIFFERENTIAL/PLATELET  BRAIN NATRIURETIC PEPTIDE  HIV ANTIBODY (ROUTINE TESTING W REFLEX)  TROPONIN I (HIGH SENSITIVITY)    EKG EKG Interpretation  Date/Time:  Thursday February 03 2023 13:17:12 EDT Ventricular Rate:  64 PR Interval:  200 QRS Duration: 102 QT Interval:  421 QTC Calculation: 435 R Axis:   75 Text Interpretation: Sinus rhythm Probable anteroseptal infarct, old No old tracing to compare Confirmed by Melene Plan [phone]) on 02/03/2023 2:51:13 PM  Radiology DG Chest Port 1 View  Result Date: 02/03/2023 CLINICAL DATA:  Shortness of breath. EXAM: PORTABLE CHEST 1 VIEW COMPARISON:  12/17/2022. FINDINGS: Rotated patient. Within this limitation, stable cardiac and mediastinal contours. Clear lungs. No pleural effusion or pneumothorax.  Visualized bones and upper abdomen are unremarkable. IMPRESSION: No evidence of acute cardiopulmonary disease. Electronically Signed   By: Orvan Falconer M.D.   On: 02/03/2023 14:11    Procedures .Critical Care  Performed by: Melene Plan, DO Authorized by: Melene Plan, DO   Critical care provider statement:    Critical care time (minutes):  80   Critical care time was exclusive of:  Separately billable procedures and treating other patients   Critical care was time spent personally by me on the following activities:  Development of treatment plan with patient or surrogate, discussions with consultants, evaluation of patient's response to treatment, examination of patient, ordering and review of laboratory studies, ordering and review of radiographic studies, ordering and performing treatments and interventions, pulse oximetry, re-evaluation of patient's condition and review of old charts   Care discussed with: admitting provider       Medications Ordered in ED Medications  enoxaparin (LOVENOX) injection 40 mg (has no administration in time range)  sodium chloride flush (NS) 0.9 % injection 3 mL (has no administration in time range)  acetaminophen (TYLENOL) tablet 650 mg (has no administration in time range)    Or  acetaminophen (TYLENOL) suppository 650 mg (has no administration in time range)  polyethylene glycol (MIRALAX / GLYCOLAX) packet 17 g (has no administration in time range)  albuterol (PROVENTIL) (2.5 MG/3ML) 0.083% nebulizer solution 2.5 mg (has no administration in time range)  ipratropium (ATROVENT) nebulizer solution 0.5 mg (has no administration in time range)  predniSONE (DELTASONE) tablet 50 mg (has no administration in time range)  methylPREDNISolone sodium succinate (SOLU-MEDROL) 125 mg/2 mL injection 125 mg (125 mg Intravenous Given 02/03/23 1330)  magnesium sulfate IVPB 2 g 50 mL (0 g Intravenous Stopped 02/03/23 1401)  albuterol (PROVENTIL) (2.5 MG/3ML) 0.083% nebulizer  solution (15 mg/hr Nebulization Given 02/03/23 1326)  furosemide (LASIX) injection 20 mg (20 mg Intravenous Given 02/03/23 1451)    ED Course/ Medical Decision Making/ A&P                             Medical Decision Making Amount and/or Complexity of Data Reviewed Labs: ordered. Radiology: ordered.  Risk Prescription drug management. Decision regarding hospitalization.   72 yo M with a chief complaint of difficulty breathing.  Ongoing for the past 6 to 8 weeks but worsening over the past 48 hours.  Patient arrived in respiratory distress and was on CPAP.  Patient was given 2 DuoNeb treatments en route.  He is  wheezing quite a bit on my initial exam.  Still with some tachypnea.  Transition to BiPAP.  Will give a continuous albuterol treatment and Solu-Medrol magnesium chest x-ray blood work reassess.  Difficulty obtaining history from patient as he is in respiratory distress.  History obtained from family.  Tells me that he has been coughing but for the past couple days.  No history of asthma or COPD is that they know.  Plain film the chest independently interpreted by me without focal infiltrate or pneumothorax.  No acute anemia, no significant electrolyte abnormality.  Troponin negative.  Patient reassessed after a continuous breathing treatment through BiPAP circuitry.  He is doing a bit better.  He continues to have significant wheezes in all lung fields.  The patients results and plan were reviewed and discussed.   Any x-rays performed were independently reviewed by myself.   Differential diagnosis were considered with the presenting HPI.  Medications  enoxaparin (LOVENOX) injection 40 mg (has no administration in time range)  sodium chloride flush (NS) 0.9 % injection 3 mL (has no administration in time range)  acetaminophen (TYLENOL) tablet 650 mg (has no administration in time range)    Or  acetaminophen (TYLENOL) suppository 650 mg (has no administration in time range)   polyethylene glycol (MIRALAX / GLYCOLAX) packet 17 g (has no administration in time range)  albuterol (PROVENTIL) (2.5 MG/3ML) 0.083% nebulizer solution 2.5 mg (has no administration in time range)  ipratropium (ATROVENT) nebulizer solution 0.5 mg (has no administration in time range)  predniSONE (DELTASONE) tablet 50 mg (has no administration in time range)  methylPREDNISolone sodium succinate (SOLU-MEDROL) 125 mg/2 mL injection 125 mg (125 mg Intravenous Given 02/03/23 1330)  magnesium sulfate IVPB 2 g 50 mL (0 g Intravenous Stopped 02/03/23 1401)  albuterol (PROVENTIL) (2.5 MG/3ML) 0.083% nebulizer solution (15 mg/hr Nebulization Given 02/03/23 1326)  furosemide (LASIX) injection 20 mg (20 mg Intravenous Given 02/03/23 1451)    Vitals:   02/03/23 1317 02/03/23 1320 02/03/23 1326 02/03/23 1430  BP: (!) 178/89   (!) 148/73  Pulse: 66 65  89  Resp: 16 18  16   Temp: 97.9 F (36.6 C)     TempSrc: Oral     SpO2: 100% 100% 100% 99%  Weight:      Height:        Final diagnoses:  Acute respiratory failure with hypoxia (HCC)    Admission/ observation were discussed with the admitting physician, patient and/or family and they are comfortable with the plan.          Final Clinical Impression(s) / ED Diagnoses Final diagnoses:  Acute respiratory failure with hypoxia [facility])    Rx / DC Orders ED Discharge Orders     None         Melene Plan, DO 02/03/23 1535

## 2023-02-03 NOTE — H&P (Addendum)
History and Physical   Ronnie Palmer ZOX:[id number] DOB: July 01, 1951 DOA: 02/03/2023  PCP: Emi Belfast, FNP   Patient coming from: Home  Chief Complaint: Shortness of breath  HPI: Ronnie Palmer is a 72 y.o. male with medical history significant of tobacco use, HLD presenting with shortness of breath.  History obtained with assistance of chart review and family due to patient's dyspnea, currently using BiPAP, language barrier.  Patient has had 6 to 8 weeks of intermittent shortness of breath.  He has been seen by PCP office and urgent care multiple times with suspicion for bronchitis.  When he has been treated with steroids and albuterol he has had improvement but then his symptoms recur most recently he was treated with a course of steroids and Levaquin.    Last few days he has had significant worsening in his shortness of breath.  EMS was called today and patient was noted to be in respiratory distress and he was placed on CPAP and given 2 DuoNebs and route to the ED.  No known history of COPD nor asthma per family.  Unable to obtain full review of systems with patient due to respiratory status, on BiPAP, other language barrier.  Was able to discuss with family but no other symptoms reported.  ED Course: Vital signs in the ED notable for requiring BiPAP and oxygen to maintain saturations.  Otherwise blood pressure noted to be in the 140s to 170 systolic.  Lab workup included CMP with BUN 28, glucose 113, calcium 8.7.  CBC within normal limits.  Troponin normal.  BNP pending.  Chest x-ray showed no acute abnormality.  Patient received Solu-Medrol, 2 g magnesium, albuterol and Lasix 20 mg IV in the ED.  Has had some improvement in the ED but remains on BiPAP.  Review of Systems: Unable to obtain full review of systems with patient due to respiratory status, on BiPAP, other language barrier.  Was able to discuss with family but no other symptoms reported.  History reviewed.  No pertinent past medical history.  Past Surgical History:  Procedure Laterality Date   NO PAST SURGERIES      Social History  reports that he has been smoking cigarettes. He has a 10.00 pack-year smoking history. He has never used smokeless tobacco. He reports current alcohol use. He reports that he does not use drugs.  No Known Allergies  History reviewed. No pertinent family history.  Prior to Admission medications   Medication Sig Start Date End Date Taking? Authorizing Provider  albuterol (VENTOLIN HFA) 108 (90 Base) MCG/ACT inhaler Inhale 1-2 puffs into the lungs every 4 (four) hours as needed for wheezing or shortness of breath. 12/17/22   Tower, Audrie Gallus, MD  atorvastatin (LIPITOR) 20 MG tablet Take 1 tablet (20 mg total) by mouth daily. 10/22/20   Lorre Munroe, NP    Physical Exam: Vitals:   02/03/23 1500 02/03/23 1515 02/03/23 1530 02/03/23 1545  BP: (!) 177/73 (!) 178/82 (!) 178/81 (!) 158/78  Pulse: 88 84 85 84  Resp: 15 18 18 12   Temp:      TempSrc:      SpO2: 99% 99% 99% 98%  Weight:      Height:        Physical Exam Constitutional:      General: He is not in acute distress.    Appearance: He is ill-appearing.  HENT:     Head: Normocephalic and atraumatic.     Mouth/Throat:     Mouth: Mucous  membranes are moist.     Pharynx: Oropharynx is clear.  Eyes:     Extraocular Movements: Extraocular movements intact.     Pupils: Pupils are equal, round, and reactive to light.  Cardiovascular:     Rate and Rhythm: Normal rate and regular rhythm.     Pulses: Normal pulses.     Heart sounds: Normal heart sounds.  Pulmonary:     Effort: Pulmonary effort is normal. No respiratory distress.     Breath sounds: Stridor (Possible, unclear with BiPAP sounds) present. Wheezing and rhonchi present.  Abdominal:     General: Bowel sounds are normal. There is no distension.     Palpations: Abdomen is soft.     Tenderness: There is no abdominal tenderness.   Musculoskeletal:        General: No swelling or deformity.  Skin:    General: Skin is warm and dry.  Neurological:     General: No focal deficit present.     Mental Status: Mental status is at baseline.    Labs on Admission: I have personally reviewed following labs and imaging studies  CBC: Recent Labs  Lab 02/03/23 1320 02/03/23 1326  WBC 8.8  --   NEUTROABS 6.6  --   HGB 13.5 14.6  HCT 43.1 43.0  MCV 86.5  --   PLT 284  --     Basic Metabolic Panel: Recent Labs  Lab 02/03/23 1320 02/03/23 1326  NA 138 141  K 3.5 3.6  CL 104  --   CO2 23  --   GLUCOSE 113*  --   BUN 28*  --   CREATININE 0.93  --   CALCIUM 8.7*  --     GFR: Estimated Creatinine Clearance: 69.1 mL/min (by C-G formula based on SCr of 0.93 mg/dL).  Liver Function Tests: No results for input(s): "AST", "ALT", "ALKPHOS", "BILITOT", "PROT", "ALBUMIN" in the last 168 hours.  Urine analysis: No results found for: "COLORURINE", "APPEARANCEUR", "LABSPEC", "PHURINE", "GLUCOSEU", "HGBUR", "BILIRUBINUR", "KETONESUR", "PROTEINUR", "UROBILINOGEN", "NITRITE", "LEUKOCYTESUR"  Radiological Exams on Admission: DG Chest Port 1 View  Result Date: 02/03/2023 CLINICAL DATA:  Shortness of breath. EXAM: PORTABLE CHEST 1 VIEW COMPARISON:  12/17/2022. FINDINGS: Rotated patient. Within this limitation, stable cardiac and mediastinal contours. Clear lungs. No pleural effusion or pneumothorax. Visualized bones and upper abdomen are unremarkable. IMPRESSION: No evidence of acute cardiopulmonary disease. Electronically Signed   By: Orvan Falconer M.D.   On: 02/03/2023 14:11    EKG: Independently reviewed.  Sinus rhythm at 64 bpm.  Nonspecific T wave flattening.  Assessment/Plan Principal Problem:   Acute respiratory failure with hypoxia (HCC) Active Problems:   Elevated lipoprotein(a)   Acute respite failure with hypoxia > Patient presented via EMS who found him in respiratory distress and placed him on CPAP,  transition to BiPAP in the ED.  Has received 2 DuoNebs, albuterol, Solu-Medrol, 2 g IV magnesium with some improvement in breathing.  Has also received some Lasix. > History obtained with assistance of family and chart as above.  No reported history of asthma nor COPD.  Does have tobacco use history.  Symptoms have been waxing and waning for the last 6 to 8 weeks but no significant symptoms prior to that. > Patient has been treated for suspected bronchitis and has had improvement with steroids and albuterol but symptoms recur afterwards.  Also treated with a  course of Levaquin for possible bronchitis. > Significant wheezing on exam.  Workup unrevealing in ED with no leukocytosis,  normal troponin, BNP still pending.  Chest x-ray without acute abnormality. > Has had some improvement with steroids, breathing treatments, magnesium. > CTA PE study has been ordered for further evaluation of lungs and rule out pulmonary embolism - Monitor on progressive unit overnight - Continue with BiPAP, wean as tolerated to oxygen and then wean off oxygen as tolerated - Continue with daily steroids - Scheduled Atrovent - As needed albuterol - Follow-up CTA PE study - Add on CT ST Neck to eval for VCD, with ?stridor on exam - Follow-up BNP  Tachycardia > Notified by RN on the floor that after patient arrived he had some tachycardia in the 120s to 130s.  There was concern that this could be A-fib based on telemetry. > Heart rate has improved to the 100s to 110s per RN. - EKG - Consider IV metoprolol if elevated heart rate recurs/sustains without trigger  Hyperlipidemia - No longer taking atorvastatin per chart, will confirm with pharmacy  Tobacco use - Noted, raises suspicion for COPD pending CT results. - Will benefit from cessation, unable to discuss in detail at this time.  DVT prophylaxis: Lovenox Code Status:   Full Family Communication:  Updated at bedside Disposition Plan:   Patient is  from:  Home  Anticipated DC to:  Home  Anticipated DC date:  1 to 3 days  Anticipated DC barriers: None  Consults called:  None Admission status:  Observation, progressive  Severity of Illness: The appropriate patient status for this patient is OBSERVATION. Observation status is judged to be reasonable and necessary in order to provide the required intensity of service to ensure the patient's safety. The patient's presenting symptoms, physical exam findings, and initial radiographic and laboratory data in the context of their medical condition is felt to place them at decreased risk for further clinical deterioration. Furthermore, it is anticipated that the patient will be medically stable for discharge from the hospital within 2 midnights of admission.    Synetta Fail MD [facility]  How to contact the [facility] Attending or Consulting provider 7A - 7P or covering provider during after hours 7P -7A, for this patient?   Check the care team in [facility] and look for a) attending/consulting [facility] provider listed and b) the [facility] team listed Log into [email or web address] and use [place]'s universal password to access. If you do not have the password, please contact the hospital operator. Locate the [facility] provider you are looking for under [facility] and page to a number that you can be directly reached. If you still have difficulty reaching the provider, please page the [facility] (Director on Call) for the Hospitalists listed on amion for assistance.  02/03/2023, 4:04 PM

## 2023-02-03 NOTE — ED Notes (Signed)
ED TO INPATIENT HANDOFF REPORT  ED Nurse Name and Phone #: Ned Clines Name/Age/Gender Ronnie Palmer 72 y.o. male Room/Bed: 018C/018C  Code Status   Code Status: Full Code  Home/SNF/Other Home Patient oriented to: self, place, and time Is this baseline? Yes   Triage Complete: Triage complete  Chief Complaint Acute respiratory failure with hypoxia (HCC) [J96.01]  Triage Note No notes on file   Allergies No Known Allergies  Level of Care/Admitting Diagnosis ED Disposition     ED Disposition  Admit   Condition  --   Comment  Hospital Area: [facility] [[id number]]  Level of Care: Progressive [102]  Admit to Progressive based on following criteria: RESPIRATORY PROBLEMS hypoxemic/hypercapnic respiratory failure that is responsive to NIPPV (BiPAP) or High Flow Nasal Cannula (6-80 lpm). Frequent assessment/intervention, no > Q2 hrs < Q4 hrs, to maintain oxygenation and pulmonary hygiene.  May place patient in observation at [facility] or [facility] if equivalent level of care is available:: No  Covid Evaluation: Symptomatic Person Under Investigation (PUI) or recent exposure (last 10 days) *Testing Required*  Diagnosis: Acute respiratory failure with hypoxia [facility]) [[id number]]  Admitting Physician: Synetta Fail [[id number]]  Attending Physician: Synetta Fail [[id number]]          B Medical/Surgery History History reviewed. No pertinent past medical history. Past Surgical History:  Procedure Laterality Date   NO PAST SURGERIES       A IV Location/Drains/Wounds Patient Lines/Drains/Airways Status     Active Line/Drains/Airways     Name Placement date Placement time Site Days   Peripheral IV 02/03/23 18 G Right Antecubital 02/03/23  1300  Antecubital  less than 1   Peripheral IV 02/03/23 18 G Left Antecubital 02/03/23  1300  Antecubital  less than 1            Intake/Output Last 24 hours  Intake/Output Summary (Last 24 hours)  at 02/03/2023 1706 Last data filed at 02/03/2023 1401 Gross per 24 hour  Intake 50 ml  Output --  Net 50 ml    Labs/Imaging Results for orders placed or performed during the hospital encounter of 02/03/23 (from the past 48 hour(s))  CBC with Differential     Status: None   Collection Time: 02/03/23  1:20 PM  Result Value Ref Range   WBC 8.8 4.0 - 10.5 K/uL   RBC 4.98 4.22 - 5.81 MIL/uL   Hemoglobin 13.5 13.0 - 17.0 g/dL   HCT 13.0 86.5 - 78.4 %   MCV 86.5 80.0 - 100.0 fL   MCH 27.1 26.0 - 34.0 pg   MCHC 31.3 30.0 - 36.0 g/dL   RDW 69.6 29.5 - 28.4 %   Platelets 284 150 - 400 K/uL   nRBC 0.0 0.0 - 0.2 %   Neutrophils Relative % 75 %   Neutro Abs 6.6 1.7 - 7.7 K/uL   Lymphocytes Relative 17 %   Lymphs Abs 1.5 0.7 - 4.0 K/uL   Monocytes Relative 3 %   Monocytes Absolute 0.3 0.1 - 1.0 K/uL   Eosinophils Relative 3 %   Eosinophils Absolute 0.3 0.0 - 0.5 K/uL   Basophils Relative 1 %   Basophils Absolute 0.1 0.0 - 0.1 K/uL   Immature Granulocytes 1 %   Abs Immature Granulocytes 0.07 0.00 - 0.07 K/uL    Comment: Performed at [facility], [address], [place], [place] [address]  Basic metabolic panel     Status: Abnormal  Collection Time: 02/03/23  1:20 PM  Result Value Ref Range   Sodium 138 135 - 145 mmol/L   Potassium 3.5 3.5 - 5.1 mmol/L   Chloride 104 98 - 111 mmol/L   CO2 23 22 - 32 mmol/L   Glucose, Bld 113 (H) 70 - 99 mg/dL    Comment: Glucose reference range applies only to samples taken after fasting for at least 8 hours.   BUN 28 (H) 8 - 23 mg/dL   Creatinine, Ser 1.61 0.61 - 1.24 mg/dL   Calcium 8.7 (L) 8.9 - 10.3 mg/dL   GFR, Estimated >09 >60 mL/min    Comment: (NOTE) Calculated using the CKD-EPI Creatinine Equation (2021)    Anion gap 11 5 - 15    Comment: Performed at [facility], [address] [place], [place] [address]  Troponin I (High Sensitivity)     Status: None   Collection Time: 02/03/23  1:20 PM  Result Value Ref Range    Troponin I (High Sensitivity) 7 <18 ng/L    Comment: (NOTE) Elevated high sensitivity troponin I (hsTnI) values and significant  changes across serial measurements may suggest ACS but many other  chronic and acute conditions are known to elevate hsTnI results.  Refer to the "Links" section for chest pain algorithms and additional  guidance. Performed at [facility], [address], [place], [place] [address]   I-Stat venous blood gas, [facility], [facility], [facility])     Status: Abnormal   Collection Time: 02/03/23  1:26 PM  Result Value Ref Range   pH, Ven 7.427 7.25 - 7.43   pCO2, Ven 44.3 44 - 60 mmHg   pO2, Ven 53 (H) 32 - 45 mmHg   Bicarbonate 29.3 (H) 20.0 - 28.0 mmol/L   TCO2 31 22 - 32 mmol/L   O2 Saturation 88 %   Acid-Base Excess 4.0 (H) 0.0 - 2.0 mmol/L   Sodium 141 135 - 145 mmol/L   Potassium 3.6 3.5 - 5.1 mmol/L   Calcium, Ion 1.11 (L) 1.15 - 1.40 mmol/L   HCT 43.0 39.0 - 52.0 %   Hemoglobin 14.6 13.0 - 17.0 g/dL   Sample type VENOUS    DG Chest Port 1 View  Result Date: 02/03/2023 CLINICAL DATA:  Shortness of breath. EXAM: PORTABLE CHEST 1 VIEW COMPARISON:  12/17/2022. FINDINGS: Rotated patient. Within this limitation, stable cardiac and mediastinal contours. Clear lungs. No pleural effusion or pneumothorax. Visualized bones and upper abdomen are unremarkable. IMPRESSION: No evidence of acute cardiopulmonary disease. Electronically Signed   By: Orvan Falconer M.D.   On: 02/03/2023 14:11    Pending Labs Unresulted Labs (From admission, onward)     Start     Ordered   02/10/23 0500  Creatinine, serum  (enoxaparin (LOVENOX)    CrCl >/= 30 ml/min)  Weekly,   R     Comments: while on enoxaparin therapy    02/03/23 1457   02/04/23 0500  Comprehensive metabolic panel  Tomorrow morning,   R        02/03/23 1457   02/04/23 0500  CBC  Tomorrow morning,   R        02/03/23 1457   02/03/23 1450  HIV Antibody (routine testing w rflx)  (HIV Antibody (Routine testing w reflex)  panel)  Once,   R        02/03/23 1457   02/03/23 1315  Brain natriuretic peptide  Once,   URGENT  02/03/23 1315            Vitals/Pain Today's Vitals   02/03/23 1515 02/03/23 1530 02/03/23 1545 02/03/23 1600  BP: (!) 178/82 (!) 178/81 (!) 158/78   Pulse: 84 85 84 86  Resp: 18 18 12 15   Temp:      TempSrc:      SpO2: 99% 99% 98% 100%  Weight:      Height:      PainSc:        Isolation Precautions No active isolations  Medications Medications  enoxaparin (LOVENOX) injection 40 mg (40 mg Subcutaneous Not Given 02/03/23 1548)  sodium chloride flush (NS) 0.9 % injection 3 mL (has no administration in time range)  acetaminophen (TYLENOL) tablet 650 mg (has no administration in time range)    Or  acetaminophen (TYLENOL) suppository 650 mg (has no administration in time range)  polyethylene glycol (MIRALAX / GLYCOLAX) packet 17 g (has no administration in time range)  albuterol (PROVENTIL) (2.5 MG/3ML) 0.083% nebulizer solution 2.5 mg (has no administration in time range)  ipratropium (ATROVENT) nebulizer solution 0.5 mg (has no administration in time range)  predniSONE (DELTASONE) tablet 50 mg (has no administration in time range)  methylPREDNISolone sodium succinate (SOLU-MEDROL) 125 mg/2 mL injection 125 mg (125 mg Intravenous Given 02/03/23 1330)  magnesium sulfate IVPB 2 g 50 mL (0 g Intravenous Stopped 02/03/23 1401)  albuterol (PROVENTIL) (2.5 MG/3ML) 0.083% nebulizer solution (15 mg/hr Nebulization Given 02/03/23 1326)  furosemide (LASIX) injection 20 mg (20 mg Intravenous Given 02/03/23 1451)  iohexol (OMNIPAQUE) 350 MG/ML injection 135 mL (135 mLs Intravenous Contrast Given 02/03/23 1705)    Mobility walks        R Recommendations: See Admitting Provider Note  Report given to:   Additional Notes: pt speaks a dialect of loas

## 2023-02-04 ENCOUNTER — Ambulatory Visit: Payer: No Typology Code available for payment source | Admitting: Family Medicine

## 2023-02-04 DIAGNOSIS — F17210 Nicotine dependence, cigarettes, uncomplicated: Secondary | ICD-10-CM | POA: Diagnosis not present

## 2023-02-04 DIAGNOSIS — Z79899 Other long term (current) drug therapy: Secondary | ICD-10-CM | POA: Diagnosis not present

## 2023-02-04 DIAGNOSIS — E7841 Elevated Lipoprotein(a): Secondary | ICD-10-CM | POA: Diagnosis not present

## 2023-02-04 DIAGNOSIS — J9601 Acute respiratory failure with hypoxia: Secondary | ICD-10-CM | POA: Diagnosis present

## 2023-02-04 DIAGNOSIS — I480 Paroxysmal atrial fibrillation: Secondary | ICD-10-CM | POA: Diagnosis not present

## 2023-02-04 DIAGNOSIS — J441 Chronic obstructive pulmonary disease with (acute) exacerbation: Secondary | ICD-10-CM | POA: Diagnosis not present

## 2023-02-04 DIAGNOSIS — R0602 Shortness of breath: Secondary | ICD-10-CM | POA: Diagnosis not present

## 2023-02-04 DIAGNOSIS — J45901 Unspecified asthma with (acute) exacerbation: Secondary | ICD-10-CM | POA: Diagnosis not present

## 2023-02-04 DIAGNOSIS — K219 Gastro-esophageal reflux disease without esophagitis: Secondary | ICD-10-CM | POA: Diagnosis not present

## 2023-02-04 LAB — COMPREHENSIVE METABOLIC PANEL
ALT: 29 U/L (ref 0–44)
AST: 30 U/L (ref 15–41)
Albumin: 3 g/dL — ABNORMAL LOW (ref 3.5–5.0)
Alkaline Phosphatase: 57 U/L (ref 38–126)
Anion gap: 12 (ref 5–15)
BUN: 27 mg/dL — ABNORMAL HIGH (ref 8–23)
CO2: 24 mmol/L (ref 22–32)
Calcium: 9 mg/dL (ref 8.9–10.3)
Chloride: 103 mmol/L (ref 98–111)
Creatinine, Ser: 0.97 mg/dL (ref 0.61–1.24)
GFR, Estimated: >60 mL/min (ref >60)
Glucose, Bld: 126 mg/dL — ABNORMAL HIGH (ref 70–99)
Potassium: 3.8 mmol/L (ref 3.5–5.1)
Sodium: 139 mmol/L (ref 135–145)
Total Bilirubin: 0.4 mg/dL (ref 0.3–1.2)
Total Protein: 6.6 g/dL (ref 6.5–8.1)

## 2023-02-04 LAB — CBC
HCT: 41.8 % (ref 39.0–52.0)
Hemoglobin: 13.3 g/dL (ref 13.0–17.0)
MCH: 26.8 pg (ref 26.0–34.0)
MCHC: 31.8 g/dL (ref 30.0–36.0)
MCV: 84.1 fL (ref 80.0–100.0)
Platelets: 289 10*3/uL (ref 150–400)
RBC: 4.97 MIL/uL (ref 4.22–5.81)
RDW: 14.1 % (ref 11.5–15.5)
WBC: 10.4 10*3/uL (ref 4.0–10.5)
nRBC: 0 % (ref 0.0–0.2)

## 2023-02-04 MED ORDER — LORATADINE 10 MG PO TABS
10.0000 mg | ORAL_TABLET | Freq: Every day | ORAL | Status: DC
  Administered 2023-02-04 – 2023-02-05 (×2): 10 mg via ORAL
  Filled 2023-02-04 (×2): qty 1

## 2023-02-04 MED ORDER — PANTOPRAZOLE SODIUM 40 MG PO TBEC
40.0000 mg | DELAYED_RELEASE_TABLET | Freq: Every day | ORAL | Status: DC
  Administered 2023-02-04 – 2023-02-05 (×2): 40 mg via ORAL
  Filled 2023-02-04 (×2): qty 1

## 2023-02-04 MED ORDER — BUDESONIDE 0.5 MG/2ML IN SUSP
0.5000 mg | Freq: Two times a day (BID) | RESPIRATORY_TRACT | Status: DC
  Administered 2023-02-04 – 2023-02-05 (×2): 0.5 mg via RESPIRATORY_TRACT
  Filled 2023-02-04 (×2): qty 2

## 2023-02-04 MED ORDER — IPRATROPIUM-ALBUTEROL 0.5-2.5 (3) MG/3ML IN SOLN
3.0000 mL | Freq: Four times a day (QID) | RESPIRATORY_TRACT | Status: DC
  Administered 2023-02-04 – 2023-02-05 (×3): 3 mL via RESPIRATORY_TRACT
  Filled 2023-02-04 (×3): qty 3

## 2023-02-04 MED ORDER — GUAIFENESIN ER 600 MG PO TB12
600.0000 mg | ORAL_TABLET | Freq: Two times a day (BID) | ORAL | Status: DC
  Administered 2023-02-04 – 2023-02-05 (×3): 600 mg via ORAL
  Filled 2023-02-04 (×3): qty 1

## 2023-02-04 MED ORDER — ALBUTEROL SULFATE (2.5 MG/3ML) 0.083% IN NEBU: 2.5000 mg | INHALATION_SOLUTION | Freq: Four times a day (QID) | RESPIRATORY_TRACT | Status: DC | PRN

## 2023-02-04 MED ORDER — METHYLPREDNISOLONE SODIUM SUCC 40 MG IJ SOLR
60.0000 mg | Freq: Two times a day (BID) | INTRAMUSCULAR | Status: DC
  Administered 2023-02-04 – 2023-02-05 (×2): 60 mg via INTRAVENOUS
  Filled 2023-02-04 (×2): qty 2

## 2023-02-04 MED ORDER — HYDROCOD POLI-CHLORPHE POLI ER 10-8 MG/5ML PO SUER
5.0000 mL | Freq: Two times a day (BID) | ORAL | Status: DC
  Administered 2023-02-04 – 2023-02-05 (×3): 5 mL via ORAL
  Filled 2023-02-04 (×3): qty 5

## 2023-02-04 NOTE — Progress Notes (Signed)
SATURATION QUALIFICATIONS: (This note is used to comply with regulatory documentation for home oxygen)  Patient Saturations on Room Air at Rest = 95%  Patient Saturations on Room Air while Ambulating = 94%    Please briefly explain why patient needs home oxygen: no indication for supplemental oxygen

## 2023-02-04 NOTE — Progress Notes (Signed)
Patient heart rate at 110s to 130s, atrial fibrillation confirmed with 12 leads EKG.,B/P 144/64 ,patient is asymptomatic. Dr. Delma Post made aware. Will continue to monitor.

## 2023-02-04 NOTE — Progress Notes (Signed)
PROGRESS NOTE  Wes Lezotte  ZOX:[id number] DOB: 09/15/1950 DOA: 02/03/2023 PCP: Emi Belfast, FNP   Brief Narrative: Patient is a 72 year old male from [place] with history of tobacco use, hyperlipidemia who presented to the emergency department with complaint of shortness of breath.  Report of ongoing dyspnea for the last 6 to 8 weeks.  Patient was seen by his PCP and urgent care multiple times and was treated for bronchitis with antibiotics, steroids without significant relief so presented to the emergency department.  On presentation, patient required BiPAP to maintain saturation.  He was tachycardic.  Chest x-ray did not show any acute findings.  Patient was admitted for the management of possible new onset COPD exacerbation.  Continues to wheeze today.  Has been weaned to room air.  Could not be discharged today because of persistent wheezing, cough  Assessment & Plan:  Principal Problem:   Acute respiratory failure with hypoxia (HCC) Active Problems:   Elevated lipoprotein(a)   Acute hypoxic respiratory failure (HCC)  Acute hypoxic respiratory failure: Hypoxic on presentation, required BiPAP.  No evidence of PE as per CT angio, CT soft tissue neck normal.  No evidence of pneumonia. given breathing treatment, this morning he was on 4 L of oxygen which has been weaned.  So far he has been tolerating room air. This is most likely secondary to new onset acute COPD exacerbation.  New onset COPD: Smokes cigarettes.  No history of lung disease.  Found to be wheezing, short of breath on presentation.  Found to have bilateral expiratory wheezing, diminished air sounds this morning.  Continue high-dose steroids, bronchodilators, Pulmicort, incentive spirometer. Complains of irritation  of throat and has violent cough.  Started on loratadine, Mucinex, Tussionex.  GERD could also play a role here.  Started on Protonix. When he is discharged, he will benefit with nebulizer machine.  He  should follow-up with pulmonology as an outpatient.  I have put a referral for discharge med/rec.  He might need PFT as an outpatient. On discharge ,he will benefit with prescription of albuterol inhaler, beta agonist-steroid inhaler. Patient has history of working at [employer], he might be exposed to dust/allergens as per the son.  Need to rule out ILD as well.  But chest imagings are clear PFT will answer these questions  Tobacco use: Counseled for cessation.  Continue nicotine patch            DVT prophylaxis:enoxaparin (LOVENOX) injection 40 mg Start: 02/03/23 1500     Code Status: Full Code  Family Communication: Discussed with son at bedside  Patient status: Inpatient  Patient is from : Home  Anticipated discharge to: Home  Estimated DC date: Tomorrow   Consultants: None  Procedures: None  Antimicrobials:  Anti-infectives (From admission, onward)    None       Subjective: Patient seen and examined at bedside today.  He has been weaned to room air and he was tolerating.  During my evaluation, he was having severe bilateral expiratory wheezing.  Also had a violent cough and was complaining of throat irritation.  Long discussion had at the bedside about current management plan  Objective: Vitals:   02/04/23 0600 02/04/23 0740 02/04/23 0811 02/04/23 1236  BP: 125/70  139/65 130/84  Pulse: (!) 54  (!) 55 (!) 58  Resp: 16  17 17   Temp:   97.6 F (36.4 C) 97.8 F (36.6 C)  TempSrc:   Oral Oral  SpO2: 99% 99% 98% 95%  Weight: 73.8 kg  Height:        Intake/Output Summary (Last 24 hours) at 02/04/2023 1333 Last data filed at 02/04/2023 0650 Gross per 24 hour  Intake 300 ml  Output 900 ml  Net -600 ml   Filed Weights   02/03/23 1315 02/04/23 0600  Weight: 72.8 kg 73.8 kg    Examination:  General exam: Overall comfortable, not in distress HEENT: PERRL Respiratory system: Bilateral expiratory wheezing, diminished sounds bilaterally Cardiovascular  system: S1 & S2 heard, RRR.  Gastrointestinal system: Abdomen is nondistended, soft and nontender. Central nervous system: Alert and oriented Extremities: No edema, no clubbing ,no cyanosis Skin: No rashes, no ulcers,no icterus     Data Reviewed: I have personally reviewed following labs and imaging studies  CBC: Recent Labs  Lab 02/03/23 1320 02/03/23 1326 02/04/23 0204  WBC 8.8  --  10.4  NEUTROABS 6.6  --   --   HGB 13.5 14.6 13.3  HCT 43.1 43.0 41.8  MCV 86.5  --  84.1  PLT 284  --  289   Basic Metabolic Panel: Recent Labs  Lab 02/03/23 1320 02/03/23 1326 02/04/23 0204  NA 138 141 139  K 3.5 3.6 3.8  CL 104  --  103  CO2 23  --  24  GLUCOSE 113*  --  126*  BUN 28*  --  27*  CREATININE 0.93  --  0.97  CALCIUM 8.7*  --  9.0     No results found for this or any previous visit (from the past 240 hour(s)).   Radiology Studies: CT Angio Chest PE W and/or Wo Contrast  Result Date: 02/03/2023 CLINICAL DATA:  High probability for PE EXAM: CT ANGIOGRAPHY CHEST WITH CONTRAST TECHNIQUE: Multidetector CT imaging of the chest was performed using the standard protocol during bolus administration of intravenous contrast. Multiplanar CT image reconstructions and MIPs were obtained to evaluate the vascular anatomy. RADIATION DOSE REDUCTION: This exam was performed according to the departmental dose-optimization program which includes automated exposure control, adjustment of the mA and/or kV according to patient size and/or use of iterative reconstruction technique. CONTRAST:  OMNIPAQUE IOHEXOL 350 MG/ML SOLN COMPARISON:  None Available. FINDINGS: Cardiovascular: Satisfactory opacification of the pulmonary arteries to the segmental level. No evidence of pulmonary embolism. Normal heart size. No pericardial effusion. There are atherosclerotic calcifications of the aorta. Mediastinum/Nodes: No enlarged mediastinal, hilar, or axillary lymph nodes. Thyroid gland, trachea, and esophagus  demonstrate no significant findings. Lungs/Pleura: Lungs are clear. No pleural effusion or pneumothorax. Upper Abdomen: Hepatic cysts are present measuring up to 2.9 cm. No acute findings. Musculoskeletal: No chest wall abnormality. No acute or significant osseous findings. Review of the MIP images confirms the above findings. IMPRESSION: 1. No evidence for pulmonary embolism. 2. No acute cardiopulmonary process. 3. Hepatic cysts. Aortic Atherosclerosis (ICD10-I70.0). Electronically Signed   By: Darliss Cheney M.D.   On: 02/03/2023 18:20   CT SOFT TISSUE NECK W CONTRAST  Result Date: 02/03/2023 CLINICAL DATA:  Initial evaluation for vocal cord paralysis, epiglottitis or tonsillitis. EXAM: CT NECK WITH CONTRAST TECHNIQUE: Multidetector CT imaging of the neck was performed using the standard protocol following the bolus administration of intravenous contrast. RADIATION DOSE REDUCTION: This exam was performed according to the departmental dose-optimization program which includes automated exposure control, adjustment of the mA and/or kV according to patient size and/or use of iterative reconstruction technique. CONTRAST:  OMNIPAQUE IOHEXOL 350 MG/ML SOLN COMPARISON:  Prior study from 11/25/2018. FINDINGS: Pharynx and larynx: Oral cavity within normal  limits. No acute inflammatory changes seen about the dentition. Palatine tonsils symmetric without evidence for acute tonsillitis. Parapharyngeal fat maintained. Nasopharynx and the remainder of the oropharynx within normal limits. No retropharyngeal collection or swelling. Dystrophic calcifications noted along the left follicular aspect of the epiglottis. Epiglottis is otherwise unremarkable without evidence for acute epiglottitis. Remainder of the hypopharynx and supraglottic larynx within normal limits. Glottis symmetric and within normal limits. No CT evidence for vocal cord paresis/palsy. Subglottic airway patent clear. Salivary glands: Salivary glands  including the parotid and submandibular glands are within normal limits. Thyroid: Normal. Lymph nodes: No enlarged or pathologic adenopathy seen within the neck. Vascular: Normal intravascular enhancement seen throughout the neck. Aortic atherosclerosis noted. Limited intracranial: Unremarkable. Visualized orbits: Unremarkable. Mastoids and visualized paranasal sinuses: Moderate mucosal thickening present throughout the visualized paranasal sinuses. Visualized mastoids and middle ear cavities are largely clear. Skeleton: Subcentimeter sclerotic focus noted within the T3 vertebral body, partially visualized. This appears stable as compared to prior CT from 2020, likely benign. No other worrisome osseous lesions. Moderate multilevel cervical spondylosis, progressed from prior. Upper chest: No acute finding. Other: Incidental 1.3 cm sebaceous cyst noted within the subcutaneous fat of the left posterior neck. IMPRESSION: 1. Negative CT of the neck. No acute inflammatory changes or other abnormality identified. No CT evidence for vocal cord paresis/palsy on today's exam. 2. Moderate paranasal sinusitis. 3. Moderate multilevel cervical spondylosis, progressed from prior. Aortic Atherosclerosis (ICD10-I70.0). Electronically Signed   By: Rise Mu M.D.   On: 02/03/2023 17:27   DG Chest Port 1 View  Result Date: 02/03/2023 CLINICAL DATA:  Shortness of breath. EXAM: PORTABLE CHEST 1 VIEW COMPARISON:  12/17/2022. FINDINGS: Rotated patient. Within this limitation, stable cardiac and mediastinal contours. Clear lungs. No pleural effusion or pneumothorax. Visualized bones and upper abdomen are unremarkable. IMPRESSION: No evidence of acute cardiopulmonary disease. Electronically Signed   By: Orvan Falconer M.D.   On: 02/03/2023 14:11    Scheduled Meds:  budesonide (PULMICORT) nebulizer solution  0.5 mg Nebulization BID   enoxaparin (LOVENOX) injection  40 mg Subcutaneous Q24H   ipratropium-albuterol  3 mL  Nebulization Q6H   loratadine  10 mg Oral Daily   methylPREDNISolone (SOLU-MEDROL) injection  60 mg Intravenous Q12H   metoprolol tartrate  12.5 mg Oral BID   pantoprazole  40 mg Oral Daily   sodium chloride flush  3 mL Intravenous Q12H   Continuous Infusions:   LOS: 0 days   Burnadette Pop, MD [facility] P6/02/2023, 1:33 PM

## 2023-02-05 DIAGNOSIS — J9601 Acute respiratory failure with hypoxia: Secondary | ICD-10-CM | POA: Diagnosis not present

## 2023-02-05 DIAGNOSIS — I480 Paroxysmal atrial fibrillation: Secondary | ICD-10-CM | POA: Diagnosis present

## 2023-02-05 MED ORDER — IPRATROPIUM-ALBUTEROL 0.5-2.5 (3) MG/3ML IN SOLN: 3.0000 mL | Freq: Four times a day (QID) | RESPIRATORY_TRACT | Status: DC | PRN

## 2023-02-05 MED ORDER — DILTIAZEM HCL ER COATED BEADS 120 MG PO CP24: 120.0000 mg | ORAL_CAPSULE | Freq: Every day | ORAL | 11 refills | Status: DC

## 2023-02-05 MED ORDER — GUAIFENESIN ER 600 MG PO TB12: 600.0000 mg | ORAL_TABLET | Freq: Two times a day (BID) | ORAL | Status: DC | PRN

## 2023-02-05 MED ORDER — ASPIRIN 81 MG PO TBEC: 81.0000 mg | DELAYED_RELEASE_TABLET | Freq: Every day | ORAL | 11 refills | Status: DC

## 2023-02-05 NOTE — Discharge Summary (Signed)
Physician Discharge Summary  Ronnie Palmer ZOX:[id number] DOB: 06/15/1951 DOA: 02/03/2023  PCP: Emi Belfast, FNP  Admit date: 02/03/2023 Discharge date: 02/05/2023  Admitted From: Home Disposition: Home  Recommendations for Outpatient Follow-up:  Follow up with PCP in 1 week Consider PFTs. Please obtain BMP in 1 week to ensure stability of electrolytes Due to paroxysmal atrial fibrillation, he was started on a low-dose of Cardizem 120 mg daily and aspirin.  Consider cardiology evaluation.  Home Health: None  Equipment/Devices: None   Discharge Condition: Good CODE STATUS: Full  Diet recommendation: Heart Healthy  Brief/Interim Summary: From H&P: From Dr. Versie Starks: "Ronnie Palmer is a 72 y.o. male with medical history significant of tobacco use, HLD presenting with shortness of breath.   History obtained with assistance of chart review and family due to patient's dyspnea, currently using BiPAP, language barrier.  Patient has had 6 to 8 weeks of intermittent shortness of breath.  He has been seen by PCP office and urgent care multiple times with suspicion for bronchitis.  When he has been treated with steroids and albuterol he has had improvement but then his symptoms recur most recently he was treated with a course of steroids and Levaquin.     Last few days he has had significant worsening in his shortness of breath.  EMS was called today and patient was noted to be in respiratory distress and he was placed on CPAP and given 2 DuoNebs and route to the ED.   No known history of COPD nor asthma per family.   Unable to obtain full review of systems with patient due to respiratory status, on BiPAP, other language barrier.  Was able to discuss with family but no other symptoms reported."  Interim: He steadily improved and was weaned off of oxygen, on room air the day before discharge.  No fevers.  No evidence of pneumonia or PE.  He had some coughing and was placed on  supportive medication and Protonix.  Subjective on day of discharge: Patient continues to do well with his breathing and is ready to go home.  He does not have a pulmonologist.  Found to be in atrial fibrillation leading into the morning.  Sinus rhythm when examined.  No history of this.  Discharge Diagnoses:  Principal Problem:   Acute respiratory failure with hypoxia (HCC)  Resolved upon discharge  Would consider PFTs as an outpatient or possibly a pulmonology referral  He finished prednisone and Levaquin  SABA prn  Active Problems:   Elevated lipoprotein(a)  Risk modification recommended  Paroxysmal atrial fibrillation  Likely related to principal problem  Cardizem 120 mg daily started  Aspirin daily recommended-CHA2DS2-VASc score 1  Consider outpatient cardiology evaluation of this  Discharge Instructions  Discharge Instructions     Ambulatory referral to Pulmonology   Complete by: As directed    Reason for referral: Asthma/COPD   Call MD for:  difficulty breathing, headache or visual disturbances   Complete by: As directed    Call MD for:  persistant dizziness or light-headedness   Complete by: As directed    Diet - low sodium heart healthy   Complete by: As directed    Increase activity slowly   Complete by: As directed       Allergies as of 02/05/2023   No Known Allergies      Medication List     STOP taking these medications    levofloxacin 500 MG tablet Commonly known as: LEVAQUIN   predniSONE 20  MG tablet Commonly known as: DELTASONE       TAKE these medications    albuterol 108 (90 Base) MCG/ACT inhaler Commonly known as: VENTOLIN HFA Inhale 1-2 puffs into the lungs every 4 (four) hours as needed for wheezing or shortness of breath.   aspirin EC 81 MG tablet Take 1 tablet (81 mg total) by mouth daily. Swallow whole.   atorvastatin 20 MG tablet Commonly known as: LIPITOR Take 1 tablet (20 mg total) by mouth daily.   diltiazem 120 MG 24  hr capsule Commonly known as: Cardizem CD Take 1 capsule (120 mg total) by mouth daily.   guaiFENesin 600 MG 12 hr tablet Commonly known as: MUCINEX Take 1 tablet (600 mg total) by mouth 2 (two) times daily as needed for cough or to loosen phlegm.   TYLENOL PO Take 2 tablets by mouth 2 (two) times daily as needed (pain, fever, headache).               Durable Medical Equipment  (From admission, onward)           Start     Ordered   02/04/23 1340  For home use only DME Nebulizer machine  Once       Question Answer Comment  Patient needs a nebulizer to treat with the following condition COPD exacerbation (HCC)   Length of Need Lifetime      02/04/23 1339            Follow-up Information     [facility] Follow up.   Why: Nebulizer arranged- to be delivered to room prior to discharge Contact information: [address]  [address] [place] [address] [phone]        [facility] Follow up.   Why: [facility] will provide the home nebulizer. Contact information: [address] [place] [address] [phone]                No Known Allergies  Consultations: None   Procedures/Studies: CT Angio Chest PE W and/or Wo Contrast  Result Date: 02/03/2023 CLINICAL DATA:  High probability for PE EXAM: CT ANGIOGRAPHY CHEST WITH CONTRAST TECHNIQUE: Multidetector CT imaging of the chest was performed using the standard protocol during bolus administration of intravenous contrast. Multiplanar CT image reconstructions and MIPs were obtained to evaluate the vascular anatomy. RADIATION DOSE REDUCTION: This exam was performed according to the departmental dose-optimization program which includes automated exposure control, adjustment of the mA and/or kV according to patient size and/or use of iterative reconstruction technique. CONTRAST:  OMNIPAQUE IOHEXOL 350 MG/ML SOLN COMPARISON:  None Available. FINDINGS: Cardiovascular: Satisfactory  opacification of the pulmonary arteries to the segmental level. No evidence of pulmonary embolism. Normal heart size. No pericardial effusion. There are atherosclerotic calcifications of the aorta. Mediastinum/Nodes: No enlarged mediastinal, hilar, or axillary lymph nodes. Thyroid gland, trachea, and esophagus demonstrate no significant findings. Lungs/Pleura: Lungs are clear. No pleural effusion or pneumothorax. Upper Abdomen: Hepatic cysts are present measuring up to 2.9 cm. No acute findings. Musculoskeletal: No chest wall abnormality. No acute or significant osseous findings. Review of the MIP images confirms the above findings. IMPRESSION: 1. No evidence for pulmonary embolism. 2. No acute cardiopulmonary process. 3. Hepatic cysts. Aortic Atherosclerosis (ICD10-I70.0). Electronically Signed   By: Darliss Cheney M.D.   On: 02/03/2023 18:20   CT SOFT TISSUE NECK W CONTRAST  Result Date: 02/03/2023 CLINICAL DATA:  Initial evaluation for vocal cord paralysis, epiglottitis or tonsillitis. EXAM: CT NECK  WITH CONTRAST TECHNIQUE: Multidetector CT imaging of the neck was performed using the standard protocol following the bolus administration of intravenous contrast. RADIATION DOSE REDUCTION: This exam was performed according to the departmental dose-optimization program which includes automated exposure control, adjustment of the mA and/or kV according to patient size and/or use of iterative reconstruction technique. CONTRAST:  OMNIPAQUE IOHEXOL 350 MG/ML SOLN COMPARISON:  Prior study from 11/25/2018. FINDINGS: Pharynx and larynx: Oral cavity within normal limits. No acute inflammatory changes seen about the dentition. Palatine tonsils symmetric without evidence for acute tonsillitis. Parapharyngeal fat maintained. Nasopharynx and the remainder of the oropharynx within normal limits. No retropharyngeal collection or swelling. Dystrophic calcifications noted along the left follicular aspect of the epiglottis.  Epiglottis is otherwise unremarkable without evidence for acute epiglottitis. Remainder of the hypopharynx and supraglottic larynx within normal limits. Glottis symmetric and within normal limits. No CT evidence for vocal cord paresis/palsy. Subglottic airway patent clear. Salivary glands: Salivary glands including the parotid and submandibular glands are within normal limits. Thyroid: Normal. Lymph nodes: No enlarged or pathologic adenopathy seen within the neck. Vascular: Normal intravascular enhancement seen throughout the neck. Aortic atherosclerosis noted. Limited intracranial: Unremarkable. Visualized orbits: Unremarkable. Mastoids and visualized paranasal sinuses: Moderate mucosal thickening present throughout the visualized paranasal sinuses. Visualized mastoids and middle ear cavities are largely clear. Skeleton: Subcentimeter sclerotic focus noted within the T3 vertebral body, partially visualized. This appears stable as compared to prior CT from 2020, likely benign. No other worrisome osseous lesions. Moderate multilevel cervical spondylosis, progressed from prior. Upper chest: No acute finding. Other: Incidental 1.3 cm sebaceous cyst noted within the subcutaneous fat of the left posterior neck. IMPRESSION: 1. Negative CT of the neck. No acute inflammatory changes or other abnormality identified. No CT evidence for vocal cord paresis/palsy on today's exam. 2. Moderate paranasal sinusitis. 3. Moderate multilevel cervical spondylosis, progressed from prior. Aortic Atherosclerosis (ICD10-I70.0). Electronically Signed   By: Rise Mu M.D.   On: 02/03/2023 17:27   DG Chest Port 1 View  Result Date: 02/03/2023 CLINICAL DATA:  Shortness of breath. EXAM: PORTABLE CHEST 1 VIEW COMPARISON:  12/17/2022. FINDINGS: Rotated patient. Within this limitation, stable cardiac and mediastinal contours. Clear lungs. No pleural effusion or pneumothorax. Visualized bones and upper abdomen are unremarkable.  IMPRESSION: No evidence of acute cardiopulmonary disease. Electronically Signed   By: Orvan Falconer M.D.   On: 02/03/2023 14:11    Discharge Exam: Vitals:   02/05/23 0846 02/05/23 0900  BP: (!) 145/73 (!) 145/73  Pulse: 82   Resp: 18   Temp: 98.2 F (36.8 C) 98.2 F (36.8 C)  SpO2: 94%     General: Pt is alert, awake, not in acute distress Cardiovascular: RRR, S1/S2 +, no edema Respiratory: Faint exp wheezing heard throughout, no rhonchi, no respiratory distress, no conversational dyspnea  Abdominal: Soft, NT, ND, bowel sounds + Extremities: no edema, no cyanosis Psych: Normal mood and affect, stable judgement and insight     The results of significant diagnostics from this hospitalization (including imaging, microbiology, ancillary and laboratory) are listed below for reference.    Labs: BNP (last 3 results) Recent Labs    02/03/23 1849  BNP 76.2   Basic Metabolic Panel: Recent Labs  Lab 02/03/23 1320 02/03/23 1326 02/04/23 0204  NA 138 141 139  K 3.5 3.6 3.8  CL 104  --  103  CO2 23  --  24  GLUCOSE 113*  --  126*  BUN 28*  --  27*  CREATININE 0.93  --  0.97  CALCIUM 8.7*  --  9.0   Liver Function Tests: Recent Labs  Lab 02/04/23 0204  AST 30  ALT 29  ALKPHOS 57  BILITOT 0.4  PROT 6.6  ALBUMIN 3.0*   CBC: Recent Labs  Lab 02/03/23 1320 02/03/23 1326 02/04/23 0204  WBC 8.8  --  10.4  NEUTROABS 6.6  --   --   HGB 13.5 14.6 13.3  HCT 43.1 43.0 41.8  MCV 86.5  --  84.1  PLT 284  --  289   Sepsis Labs Recent Labs  Lab 02/03/23 1320 02/04/23 0204  WBC 8.8 10.4   Patient was seen and examined on the day of discharge and was found to be in stable condition. Time coordinating discharge: 32 minutes including assessment and coordination of care, as well as examination of the patient.   SIGNED:  Sharlene Dory, DO [facility] 02/05/2023, 2:25 PM

## 2023-02-05 NOTE — Discharge Instructions (Signed)

## 2023-02-05 NOTE — Progress Notes (Addendum)
Received referral to assist with home nebulizer. Per Susmit, RN, she contacted [facility] regarding the nebulizer, but they won't delivered the nebulizer on the weekend without oxygen.  Spoke with pt and discussed the home nebulizer. Pt reports he doesn't have a preference for a DME agency. Will contact [facility] for the referral. Pt agrees to use [facility]. Contacted Jasmine with [facility] and she accepted the referral.    Addendum: Received call from [facility] and she reports that the home nebulizer requires prior authorization by his insurance and they are closed on the weekends. They will f/u on Monday and they will deliver the nebulizer if its approved. Pt and son Caryn Bee) made aware and they are ok with waiting until insurance approval.

## 2023-02-08 ENCOUNTER — Other Ambulatory Visit (HOSPITAL_COMMUNITY): Payer: Self-pay

## 2023-02-08 ENCOUNTER — Other Ambulatory Visit: Payer: Self-pay | Admitting: Internal Medicine

## 2023-02-08 MED ORDER — IPRATROPIUM-ALBUTEROL 0.5-2.5 (3) MG/3ML IN SOLN: 3.0000 mL | Freq: Four times a day (QID) | RESPIRATORY_TRACT | 2 refills | Status: DC | PRN

## 2023-02-08 NOTE — Progress Notes (Signed)
Patient's son called back to the unit and stated they did not have a prescription for nebulizer solution.  This RN reached out to Dr Burnadette Pop, who did not discharge the patient but did see him during his stay, Dr Renford Dills sent prescription to [facility] in [place] for the patient.

## 2023-02-08 NOTE — Progress Notes (Unsigned)
{  Select_TRH_Note:[id number]} 

## 2023-02-09 ENCOUNTER — Telehealth: Payer: Self-pay | Admitting: Pulmonary Disease

## 2023-02-09 ENCOUNTER — Ambulatory Visit (INDEPENDENT_AMBULATORY_CARE_PROVIDER_SITE_OTHER): Payer: No Typology Code available for payment source | Admitting: Pulmonary Disease

## 2023-02-09 ENCOUNTER — Encounter: Payer: Self-pay | Admitting: Pulmonary Disease

## 2023-02-09 VITALS — BP 134/68 | HR 87 | Ht 67.0 in | Wt 163.2 lb

## 2023-02-09 DIAGNOSIS — F17210 Nicotine dependence, cigarettes, uncomplicated: Secondary | ICD-10-CM

## 2023-02-09 DIAGNOSIS — J40 Bronchitis, not specified as acute or chronic: Secondary | ICD-10-CM | POA: Diagnosis not present

## 2023-02-09 MED ORDER — FLUTICASONE-SALMETEROL 115-21 MCG/ACT IN AERO
2.0000 | INHALATION_SPRAY | Freq: Two times a day (BID) | RESPIRATORY_TRACT | 12 refills | Status: DC
Start: 2023-02-09 — End: 2023-02-17

## 2023-02-09 MED ORDER — PREDNISONE 10 MG PO TABS
ORAL_TABLET | ORAL | 0 refills | Status: AC
Start: 2023-02-09 — End: 2023-02-20

## 2023-02-09 NOTE — Progress Notes (Signed)
Synopsis: Referred in June 2024 for hospital follow up for acute hypoxemic respiratory failure  Subjective:   PATIENT ID: Ronnie Palmer GENDER: male DOB: 1951-04-20, MRN: [id number]  HPI  Chief Complaint  Patient presents with   Consult    Referred from ED for increased wheezing, coughing and SOB since April 2024. Productive cough with white phlegm.    Ronnie Palmer is a 72 year old male, daily smoker who is referred to pulmonary clinic for hospital follow up.  He was admitted 6/6 to 6/8 for acute hypoxemic respiratory failure due to possible asthma/COPD exacerbation and paroxysmal atrial fibrillation. He was treated with prednisone and Levaquin. Started on Cardizem and daily aspirin. Prior to this admission he was seen in the ER on 4/7 for cough.   No issues with GERD symptoms. No seasonal allergies. No sinus congestion or post nasal drainage. He has night time awakenings due to cough. No fevers, chills or sweats. His appetite is down. His weight is down a bit. No prior episodes like this in the past with his cough and breathing. He was active prior to developing these symptoms, he would walk for 1 hour in the morning and in the evenings.   Absolute eosinophil count was 300 on 02/03/23  He has been smoking 0.5ppd for 50+ years. He has quit smoking for now. He worked as a Patent examiner. No other dust or chemical exposures. He is accompanied by his daughter.  Past Medical History:  Diagnosis Date   Paroxysmal atrial fibrillation (HCC)      No family history on file.   Social History   Socioeconomic History   Marital status: Married    Spouse name: Not on file   Number of children: Not on file   Years of education: Not on file   Highest education level: Not on file  Occupational History   Not on file  Tobacco Use   Smoking status: Former    Packs/day: 0.25    Years: 40.00    Additional pack years: 0.00    Total pack years: 10.00    Types: Cigarettes     Quit date: 12/17/2022    Years since quitting: 0.1   Smokeless tobacco: Never   Tobacco comments:    Has not smoked since sick  Vaping Use   Vaping Use: Never used  Substance and Sexual Activity   Alcohol use: Yes    Comment: social drinker   Drug use: Never   Sexual activity: Yes  Other Topics Concern   Not on file  Social History Narrative   ** Merged History Encounter **       Social Determinants of Health   Financial Resource Strain: Not on file  Food Insecurity: Not on file  Transportation Needs: Not on file  Physical Activity: Not on file  Stress: Not on file  Social Connections: Not on file  Intimate Partner Violence: Not on file     No Known Allergies   Outpatient Medications Prior to Visit  Medication Sig Dispense Refill   Acetaminophen (TYLENOL PO) Take 2 tablets by mouth 2 (two) times daily as needed (pain, fever, headache).     albuterol (VENTOLIN HFA) 108 (90 Base) MCG/ACT inhaler Inhale 1-2 puffs into the lungs every 4 (four) hours as needed for wheezing or shortness of breath. 18 g 3   aspirin EC 81 MG tablet Take 1 tablet (81 mg total) by mouth daily. Swallow whole. 30 tablet 11   diltiazem (CARDIZEM CD) 120 MG  24 hr capsule Take 1 capsule (120 mg total) by mouth daily. 30 capsule 11   guaiFENesin (MUCINEX) 600 MG 12 hr tablet Take 1 tablet (600 mg total) by mouth 2 (two) times daily as needed for cough or to loosen phlegm.     ipratropium-albuterol (DUONEB) 0.5-2.5 (3) MG/3ML SOLN Take 3 mLs by nebulization every 6 (six) hours as needed. 360 mL 2   atorvastatin (LIPITOR) 20 MG tablet Take 1 tablet (20 mg total) by mouth daily. (Patient not taking: Reported on 02/03/2023) 90 tablet 0   No facility-administered medications prior to visit.   Review of Systems  Constitutional:  Negative for chills, fever, malaise/fatigue and weight loss.  HENT:  Positive for sore throat. Negative for congestion and sinus pain.   Eyes: Negative.   Respiratory:  Positive for  cough, shortness of breath and wheezing. Negative for hemoptysis and sputum production.   Cardiovascular:  Positive for palpitations. Negative for chest pain, orthopnea, claudication and leg swelling.  Gastrointestinal:  Negative for abdominal pain, heartburn, nausea and vomiting.  Genitourinary: Negative.   Musculoskeletal:  Negative for joint pain and myalgias.  Skin:  Negative for rash.  Neurological:  Negative for weakness.  Endo/Heme/Allergies: Negative.   Psychiatric/Behavioral: Negative.     Objective:   Vitals:   02/09/23 0826  BP: 134/68  Pulse: 87  SpO2: 95%  Weight: 163 lb 3.2 oz (74 kg)  Height: 5\' 7"  (1.702 m)    Physical Exam Constitutional:      General: He is not in acute distress. HENT:     Head: Normocephalic and atraumatic.  Eyes:     Conjunctiva/sclera: Conjunctivae normal.  Cardiovascular:     Rate and Rhythm: Normal rate and regular rhythm.     Pulses: Normal pulses.     Heart sounds: Normal heart sounds. No murmur heard. Musculoskeletal:     Right lower leg: No edema.     Left lower leg: No edema.  Skin:    General: Skin is warm and dry.  Neurological:     General: No focal deficit present.     Mental Status: He is alert.    CBC    Component Value Date/Time   WBC 10.4 02/04/2023 0204   RBC 4.97 02/04/2023 0204   HGB 13.3 02/04/2023 0204   HCT 41.8 02/04/2023 0204   PLT 289 02/04/2023 0204   MCV 84.1 02/04/2023 0204   MCH 26.8 02/04/2023 0204   MCHC 31.8 02/04/2023 0204   RDW 14.1 02/04/2023 0204   LYMPHSABS 1.5 02/03/2023 1320   MONOABS 0.3 02/03/2023 1320   EOSABS 0.3 02/03/2023 1320   BASOSABS 0.1 02/03/2023 1320      Latest Ref Rng & Units 02/04/2023    2:04 AM 02/03/2023    1:26 PM 02/03/2023    1:20 PM  BMP  Glucose 70 - 99 mg/dL 213   086   BUN 8 - 23 mg/dL 27   28   Creatinine 5.78 - 1.24 mg/dL 4.69   6.29   Sodium 528 - 145 mmol/L 139  141  138   Potassium 3.5 - 5.1 mmol/L 3.8  3.6  3.5   Chloride 98 - 111 mmol/L 103   104    CO2 22 - 32 mmol/L 24   23   Calcium 8.9 - 10.3 mg/dL 9.0   8.7    Chest imaging: CTA Chest 02/03/23 Cardiovascular: Satisfactory opacification of the pulmonary arteries to the segmental level. No evidence of pulmonary embolism. Normal heart  size. No pericardial effusion. There are atherosclerotic calcifications of the aorta.   Mediastinum/Nodes: No enlarged mediastinal, hilar, or axillary lymph nodes. Thyroid gland, trachea, and esophagus demonstrate no significant findings.   Lungs/Pleura: Lungs are clear. No pleural effusion or pneumothorax.  PFT:     No data to display         Labs:  Path:  Echo:  Heart Catheterization:    Assessment & Plan:   Bronchitis - Plan: predniSONE (DELTASONE) 10 MG tablet, fluticasone-salmeterol (ADVAIR HFA) 115-21 MCG/ACT inhaler, Pulmonary Function Test  Discussion: Ronnie Palmer is a 72 year old male, daily smoker who is referred to pulmonary clinic for hospital follow up.  He has significant smoking history with 20+ pack year history along with chronic cough since April with wheezing and mucous production concerning for chronic bronchitis. We will refer him to lung cancer screening in the future, recent CTA chest did not show nodules.  He is to start prolonged steroid taper.   He is to start advair HFA inhaler 115-67mcg 2 puffs twice daily and continue as needed albuterol inhaler or duonebs every 4-6 hours.   We discussed smoking cessation for 3 minutes. Recommended nicotine replacement therapy with 7mg  nicotine patches daily and as needed mini nicotine lozenges.   Follow up in 2 months with pulmonary function tests.  Melody Comas, MD [facility] Office: [phone]   Current Outpatient Medications:    Acetaminophen (TYLENOL PO), Take 2 tablets by mouth 2 (two) times daily as needed (pain, fever, headache)., Disp: , Rfl:    albuterol (VENTOLIN HFA) 108 (90 Base) MCG/ACT inhaler, Inhale 1-2 puffs  into the lungs every 4 (four) hours as needed for wheezing or shortness of breath., Disp: 18 g, Rfl: 3   aspirin EC 81 MG tablet, Take 1 tablet (81 mg total) by mouth daily. Swallow whole., Disp: 30 tablet, Rfl: 11   diltiazem (CARDIZEM CD) 120 MG 24 hr capsule, Take 1 capsule (120 mg total) by mouth daily., Disp: 30 capsule, Rfl: 11   fluticasone-salmeterol (ADVAIR HFA) 115-21 MCG/ACT inhaler, Inhale 2 puffs into the lungs 2 (two) times daily., Disp: 1 each, Rfl: 12   guaiFENesin (MUCINEX) 600 MG 12 hr tablet, Take 1 tablet (600 mg total) by mouth 2 (two) times daily as needed for cough or to loosen phlegm., Disp: , Rfl:    ipratropium-albuterol (DUONEB) 0.5-2.5 (3) MG/3ML SOLN, Take 3 mLs by nebulization every 6 (six) hours as needed., Disp: 360 mL, Rfl: 2   predniSONE (DELTASONE) 10 MG tablet, Take 4 tablets (40 mg total) by mouth daily with breakfast for 3 days, THEN 3 tablets (30 mg total) daily with breakfast for 3 days, THEN 2 tablets (20 mg total) daily with breakfast for 3 days, THEN 1 tablet (10 mg total) daily with breakfast for 3 days., Disp: 30 tablet, Rfl: 0

## 2023-02-09 NOTE — Telephone Encounter (Signed)
Can you run ticket on patients insurance to see what's covered?   Please route back to triage  

## 2023-02-09 NOTE — Patient Instructions (Addendum)
Start prednisone taper: 40mg  daily x 3 days 30mg  daily x 3 days 20mg  daily x 3 days 10mg  daily x 3 days  Start advair HFA inhaler 2 puffs twice daily - rinse mouth out after each use  Use duoneb nebulizer solution every 4-6 hours or the albuterol inhaler as needed  Recommend continuing to stay away from cigarettes, this will help with your cough and breathing long term.   Use 7mg  nicotine patches daily and mini nicotine lozenges as needed for nicotine replacement therapy  Follow up in 2 months with pulmonary function tests

## 2023-02-09 NOTE — Telephone Encounter (Signed)
Pt prescribed advair HFA inhaler  but insurace doesn't cover it

## 2023-02-10 ENCOUNTER — Other Ambulatory Visit (HOSPITAL_COMMUNITY): Payer: Self-pay

## 2023-02-10 NOTE — Telephone Encounter (Signed)
Daughter calling again. Pt's daughter states needs inhaler alternative ASAP. Thank you.

## 2023-02-10 NOTE — Telephone Encounter (Signed)
Drugs in same class all require a prior authorization due to dosing. All show max of 2 puffs per day. If patient is taking 2 puffs twice daily, then he will require 2 inhalers - which the insurance requires pa for.

## 2023-02-15 NOTE — Telephone Encounter (Signed)
Dr. Francine Graven, did you want him on 2 inhalers? Per the pharmacy team, his insurance is requiring a 2nd inhaler in addition to the Advair.

## 2023-02-16 NOTE — Telephone Encounter (Signed)
No, patient should be on an ICS/LABA inhaler.   I sent in Advair HFA 115-46mcg 2 puffs twice daily which is standard prescription for this type of inhaler.   Please determine which ICS/LABA inhaler is best covered for him.  Thanks, JD

## 2023-02-17 ENCOUNTER — Other Ambulatory Visit (HOSPITAL_COMMUNITY): Payer: Self-pay

## 2023-02-17 MED ORDER — FLUTICASONE-SALMETEROL 250-50 MCG/ACT IN AEPB
1.0000 | INHALATION_SPRAY | Freq: Two times a day (BID) | RESPIRATORY_TRACT | 5 refills | Status: DC
Start: 2023-02-17 — End: 2023-06-20

## 2023-02-17 NOTE — Telephone Encounter (Signed)
Spoke with pt's daughter and informed her Ronnie Palmer was called in. Daughter stated pt had paid out of pocket for 1 month of Advair but would go ahead and pick up [facility] as well. Nothing further needed at this time.

## 2023-02-17 NOTE — Telephone Encounter (Signed)
Wixella 250-22mcg 1 puff twice daily sent to his pharmacy.  Thanks, JD

## 2023-02-17 NOTE — Telephone Encounter (Signed)
Brand Adviar HFA is covered under patients plan, co-pay at this time is showing as $298.37 due to deductible.  Wixela/Generic Adviar diskus is showing as $53.13 at this time as an alternative.

## 2023-02-22 ENCOUNTER — Telehealth: Payer: Self-pay | Admitting: Pulmonary Disease

## 2023-02-22 NOTE — Telephone Encounter (Signed)
Prior Authorization needs to be filled out for Pt prev medicine the   fluticasone-salmeterol (WIXELA INHUB) 250-50 MCG/ACT AEPB

## 2023-02-23 ENCOUNTER — Other Ambulatory Visit (HOSPITAL_COMMUNITY): Payer: Self-pay

## 2023-02-23 ENCOUNTER — Ambulatory Visit (INDEPENDENT_AMBULATORY_CARE_PROVIDER_SITE_OTHER): Payer: No Typology Code available for payment source | Admitting: Nurse Practitioner

## 2023-02-23 ENCOUNTER — Encounter: Payer: Self-pay | Admitting: Nurse Practitioner

## 2023-02-23 VITALS — BP 132/60 | HR 57 | Temp 98.1°F | Ht 67.0 in | Wt 163.6 lb

## 2023-02-23 DIAGNOSIS — R0982 Postnasal drip: Secondary | ICD-10-CM

## 2023-02-23 DIAGNOSIS — Z09 Encounter for follow-up examination after completed treatment for conditions other than malignant neoplasm: Secondary | ICD-10-CM

## 2023-02-23 DIAGNOSIS — I480 Paroxysmal atrial fibrillation: Secondary | ICD-10-CM | POA: Diagnosis not present

## 2023-02-23 LAB — CBC
HCT: 40 % (ref 39.0–52.0)
Hemoglobin: 12.6 g/dL — ABNORMAL LOW (ref 13.0–17.0)
MCHC: 31.5 g/dL (ref 30.0–36.0)
MCV: 85.5 fl (ref 78.0–100.0)
Platelets: 321 10*3/uL (ref 150.0–400.0)
RBC: 4.68 Mil/uL (ref 4.22–5.81)
RDW: 13.6 % (ref 11.5–15.5)
WBC: 9.8 10*3/uL (ref 4.0–10.5)

## 2023-02-23 LAB — COMPREHENSIVE METABOLIC PANEL
ALT: 47 U/L (ref 0–53)
AST: 17 U/L (ref 0–37)
Albumin: 3.7 g/dL (ref 3.5–5.2)
Alkaline Phosphatase: 98 U/L (ref 39–117)
BUN: 26 mg/dL — ABNORMAL HIGH (ref 6–23)
CO2: 28 mEq/L (ref 19–32)
Calcium: 9.5 mg/dL (ref 8.4–10.5)
Chloride: 103 mEq/L (ref 96–112)
Creatinine, Ser: 0.78 mg/dL (ref 0.40–1.50)
GFR: 90.23 mL/min (ref >60.00)
Glucose, Bld: 88 mg/dL (ref 70–99)
Potassium: 3.4 mEq/L — ABNORMAL LOW (ref 3.5–5.1)
Sodium: 140 mEq/L (ref 135–145)
Total Bilirubin: 0.3 mg/dL (ref 0.2–1.2)
Total Protein: 7.2 g/dL (ref 6.0–8.3)

## 2023-02-23 LAB — TSH: TSH: 0.08 u[IU]/mL — ABNORMAL LOW (ref 0.35–5.50)

## 2023-02-23 MED ORDER — FLUTICASONE PROPIONATE 50 MCG/ACT NA SUSP
2.0000 | Freq: Every day | NASAL | 0 refills | Status: AC
Start: 2023-02-23 — End: ?

## 2023-02-23 NOTE — Telephone Encounter (Signed)
This was filled at [facility] on 02-17-2023. Script on patent chart shows sent with 5 refills.

## 2023-02-23 NOTE — Patient Instructions (Addendum)
Nice to see you today I have sent in a nasal spray to help with the mucous feeling in the throat I have referred you to a cardiologist Follow up with the pulmonologist as scheduled. Continue taking your medications as prescribed  Follow up with me in 3 months for your physical and full panel of labs

## 2023-02-23 NOTE — Progress Notes (Signed)
Established Patient Office Visit  Subjective   Patient ID: Ronnie Palmer, male    DOB: 01-Sep-1950  Age: 72 y.o. MRN: [id number]  Chief Complaint  Patient presents with   Establish Care    Seen in office and in hospital for respiratory issues.  Daughter here to intemperate Lisa     HPI TOC: patinet was last seen by Karleen Hampshire copland on 01/05/2023 for acute visit, [facility] tower on 12/17/2022, Deboraha Sprang on 07/30/2020   Hospital follow up: Patient was seen in the emergency department on 02/03/2023 where he was admitted with acute respiratory failure.  Patient was discharged on 02/05/2023.  Patient was referred to pulmonology and cardiology.  Patient has been seen several times for acute bronchitis with do good with steroids and would relapse was treated with Levaquin and steroids required hospitalization thereafter.  Patient was found to be in paroxysmal atrial fibrillation was placed on Cardizem 120 mg a day aspirin was recommended with a CHA2DS2-VASc 2 score of 1.  States that he is doing well with the breathing and the heart His daughter is here today as interpreter.  Patient does mention that he has a sensation/sound in his left ear.  Per patient's daughter described as possibly a popping sensation.  Patient has been taking Wixela inhaler as prescribed and has not had to use albuterol inhaler at all.  Patient also been taking the Cardizem medication for his heart as prescribed.  He denies any palpitations lightheadedness or dizziness.    Review of Systems  Constitutional:  Negative for chills and fever.  Respiratory:  Negative for shortness of breath.   Cardiovascular:  Negative for chest pain.  Gastrointestinal:  Negative for abdominal pain, constipation and diarrhea.  Neurological:  Negative for headaches.      Objective:     BP 132/60   Pulse (!) 57   Temp 98.1 F (36.7 C) (Temporal)   Ht 5\' 7"  (1.702 m)   Wt 163 lb 9.6 oz (74.2 kg)   SpO2 94%   BMI 25.62 kg/m   BP Readings from Last 3 Encounters:  02/23/23 132/60  02/09/23 134/68  02/05/23 (!) 145/73   Wt Readings from Last 3 Encounters:  02/23/23 163 lb 9.6 oz (74.2 kg)  02/09/23 163 lb 3.2 oz (74 kg)  02/05/23 165 lb 9.1 oz (75.1 kg)      Physical Exam Vitals and nursing note reviewed.  Constitutional:      Appearance: Normal appearance.  HENT:     Right Ear: Tympanic membrane, ear canal and external ear normal.     Left Ear: Tympanic membrane, ear canal and external ear normal.     Mouth/Throat:     Mouth: Mucous membranes are moist.     Pharynx: Oropharynx is clear.  Eyes:     Extraocular Movements: Extraocular movements intact.     Pupils: Pupils are equal, round, and reactive to light.  Cardiovascular:     Rate and Rhythm: Normal rate and regular rhythm.     Pulses: Normal pulses.     Heart sounds: Normal heart sounds.  Pulmonary:     Effort: Pulmonary effort is normal.     Breath sounds: Normal breath sounds.  Musculoskeletal:     Right lower leg: No edema.     Left lower leg: No edema.  Lymphadenopathy:     Cervical: No cervical adenopathy.  Skin:    General: Skin is warm.  Neurological:     General: No focal deficit present.  Mental Status: He is alert.     Deep Tendon Reflexes:     Reflex Scores:      Bicep reflexes are 2+ on the right side and 2+ on the left side.      Patellar reflexes are 2+ on the right side and 2+ on the left side.    Comments: Bilateral upper and lower extremity strength 5/5  Psychiatric:        Mood and Affect: Mood normal.        Behavior: Behavior normal.        Thought Content: Thought content normal.        Judgment: Judgment normal.      No results found for any visits on 02/23/23.    The 10-year ASCVD risk score (Arnett DK, et al., 2019) is: 15.3%    Assessment & Plan:   Problem List Items Addressed This Visit       Cardiovascular and Mediastinum   Paroxysmal atrial fibrillation (HCC)    Patient's rate and  rhythm regular to auscultation today.  Continue taking Cardizem 120 mg daily.  Ambulatory referral to cardiology placed today      Relevant Orders   CBC   Comprehensive metabolic panel   TSH   Ambulatory referral to Cardiology     Other   PND (post-nasal drip)    Patient Clearing his throat may be some mucus.  Will do fluticasone nasal spray 50 mcg per actuation 2 sprays each nostril daily.  If no improvement can reach out to consider ENT follow-up in regards to ear and throat.  Did review patient's CT of soft tissue/neck with contrast.      Relevant Medications   fluticasone (FLONASE) 50 MCG/ACT nasal spray   Hospital discharge follow-up - Primary    Did review hospitalization notes along with labs and imaging.  Patient does have follow-up with pulmonology continues in [place] inhaler as prescribed and albuterol inhaler as needed.  Did refer patient to cardiology for paroxysmal A-fib continue taking aspirin currently       Return in about 3 months (around 05/26/2023) for CPE and Labs.    Audria Nine, NP

## 2023-02-23 NOTE — Assessment & Plan Note (Signed)
Did review hospitalization notes along with labs and imaging.  Patient does have follow-up with pulmonology continues in [place] inhaler as prescribed and albuterol inhaler as needed.  Did refer patient to cardiology for paroxysmal A-fib continue taking aspirin currently

## 2023-02-23 NOTE — Assessment & Plan Note (Signed)
Patient's rate and rhythm regular to auscultation today.  Continue taking Cardizem 120 mg daily.  Ambulatory referral to cardiology placed today

## 2023-02-23 NOTE — Assessment & Plan Note (Signed)
Patient Clearing his throat may be some mucus.  Will do fluticasone nasal spray 50 mcg per actuation 2 sprays each nostril daily.  If no improvement can reach out to consider ENT follow-up in regards to ear and throat.  Did review patient's CT of soft tissue/neck with contrast.

## 2023-02-24 ENCOUNTER — Ambulatory Visit: Payer: No Typology Code available for payment source

## 2023-02-24 ENCOUNTER — Other Ambulatory Visit: Payer: Self-pay

## 2023-02-24 DIAGNOSIS — R7989 Other specified abnormal findings of blood chemistry: Secondary | ICD-10-CM

## 2023-02-24 LAB — T4, FREE: Free T4: 1.17 ng/dL (ref 0.60–1.60)

## 2023-02-24 LAB — T3, FREE: T3, Free: 2.7 pg/mL (ref 2.3–4.2)

## 2023-03-18 ENCOUNTER — Other Ambulatory Visit: Payer: Self-pay | Admitting: Nurse Practitioner

## 2023-03-18 DIAGNOSIS — R0982 Postnasal drip: Secondary | ICD-10-CM

## 2023-04-14 ENCOUNTER — Encounter (INDEPENDENT_AMBULATORY_CARE_PROVIDER_SITE_OTHER): Payer: Self-pay

## 2023-04-26 ENCOUNTER — Telehealth: Payer: Self-pay | Admitting: *Deleted

## 2023-04-26 NOTE — Telephone Encounter (Signed)
Lmovm to verify card hx*

## 2023-05-04 ENCOUNTER — Ambulatory Visit: Payer: No Typology Code available for payment source | Attending: Cardiology | Admitting: Cardiology

## 2023-05-04 ENCOUNTER — Ambulatory Visit: Payer: No Typology Code available for payment source

## 2023-05-04 ENCOUNTER — Encounter: Payer: Self-pay | Admitting: Cardiology

## 2023-05-04 VITALS — BP 142/72 | HR 58 | Ht 69.0 in | Wt 165.6 lb

## 2023-05-04 DIAGNOSIS — I480 Paroxysmal atrial fibrillation: Secondary | ICD-10-CM

## 2023-05-04 DIAGNOSIS — I2584 Coronary atherosclerosis due to calcified coronary lesion: Secondary | ICD-10-CM

## 2023-05-04 DIAGNOSIS — R030 Elevated blood-pressure reading, without diagnosis of hypertension: Secondary | ICD-10-CM | POA: Diagnosis not present

## 2023-05-04 DIAGNOSIS — I2510 Atherosclerotic heart disease of native coronary artery without angina pectoris: Secondary | ICD-10-CM | POA: Diagnosis not present

## 2023-05-04 MED ORDER — ASPIRIN 81 MG PO TBEC
81.0000 mg | DELAYED_RELEASE_TABLET | Freq: Every day | ORAL | Status: AC
Start: 2023-05-04 — End: ?

## 2023-05-04 NOTE — Progress Notes (Signed)
Cardiology Office Note:    Date:  05/04/2023   ID:  Ronnie Palmer, DOB [birth year], MRN [id number]  PCP:  [name], NP   [facility] Providers Cardiologist:  None     Referring MD: [name], NP   Chief Complaint  Patient presents with   New Patient (Initial Visit)    Referred for cardiac evaluation of paroxysmal atrial fibrillation with no cardiac history.  Patient is accompanied by his daughter today that is going to be the interpreter.     Ronnie Palmer is a 72 y.o. male who is being seen today for the evaluation of paroxysmal atrial fibrillation at the request of [name], NP.   History of Present Illness:    Ronnie Palmer is a 72 y.o. male with a hx of paroxysmal A-fib, former smoker x 40+ years presenting due to paroxysmal atrial fibrillation.  Admitted to the hospital 3 months ago after 6 weeks of feeling short of breath, treated on outpatient setting with steroids and Levaquin without improvement.  Admitted with respiratory distress, managed with inhalers, BiPAP.  While admitted, EKG did show atrial fibrillation.  Started on Cardizem 120 mg daily, aspirin 81 mg daily was prescribed but patient not currently taking.  CHA2DS2-VASc of 1.  Denies chest pain, notes occasional cough and wheezing.  Evaluated by pulmonary medicine, prescribed inhalers, has appointment with pulmonary medicine for possible pulmonary function testing upcoming.  Past Medical History:  Diagnosis Date   Paroxysmal atrial fibrillation [facility])     Past Surgical History:  Procedure Laterality Date   NO PAST SURGERIES      Current Medications: Current Meds  Medication Sig   Acetaminophen (TYLENOL PO) Take 2 tablets by mouth 2 (two) times daily as needed (pain, fever, headache).   aspirin EC 81 MG tablet Take 1 tablet (81 mg total) by mouth daily. Swallow whole.   fluticasone (FLONASE) 50 MCG/ACT nasal spray Place 2 sprays into both nostrils daily.    fluticasone-salmeterol (WIXELA INHUB) 250-50 MCG/ACT AEPB Inhale 1 puff into the lungs in the morning and at bedtime.   ipratropium-albuterol (DUONEB) 0.5-2.5 (3) MG/3ML SOLN Take 3 mLs by nebulization every 6 (six) hours as needed.     Allergies:   Patient has no known allergies.   Social History   Socioeconomic History   Marital status: Married    Spouse name: Not on file   Number of children: Not on file   Years of education: Not on file   Highest education level: Not on file  Occupational History   Not on file  Tobacco Use   Smoking status: Former    Current packs/day: 0.00    Average packs/day: 0.3 packs/day for 40.0 years (10.0 ttl pk-yrs)    Types: Cigarettes    Start date: 12/17/1982    Quit date: 12/17/2022    Years since quitting: 0.3   Smokeless tobacco: Never   Tobacco comments:    Has not smoked since sick  Vaping Use   Vaping status: Never Used  Substance and Sexual Activity   Alcohol use: Yes    Comment: social drinker   Drug use: Never   Sexual activity: Yes  Other Topics Concern   Not on file  Social History Narrative   ** Merged History Encounter **       Social Determinants of Health   Financial Resource Strain: Not on file  Food Insecurity: Not on file  Transportation Needs: Not on file  Physical Activity: Not  on file  Stress: Not on file  Social Connections: Not on file     Family History: The patient's family history is not on file.  ROS:   Please see the history of present illness.     All other systems reviewed and are negative.  EKGs/Labs/Other Studies Reviewed:    The following studies were reviewed today:  EKG Interpretation Date/Time:  Wednesday May 04 2023 08:30:34 EDT Ventricular Rate:  58 PR Interval:  212 QRS Duration:  90 QT Interval:  422 QTC Calculation: 414 R Axis:   49  Text Interpretation: Sinus bradycardia with 1st degree A-V block When compared with ECG of 04-Feb-2023 22:29, Sinus rhythm has replaced  Atrial fibrillation Vent. rate has decreased BY  54 BPM Confirmed by Debbe Odea ([id number]) on 05/04/2023 8:41:34 AM    Recent Labs: 02/03/2023: B Natriuretic Peptide 76.2 02/23/2023: ALT 47; BUN 26; Creatinine, Ser 0.78; Hemoglobin 12.6; Platelets 321.0; Potassium 3.4; Sodium 140; TSH 0.08  Recent Lipid Panel    Component Value Date/Time   CHOL 139 07/23/2020 0901   TRIG 76.0 07/23/2020 0901   HDL 54.80 07/23/2020 0901   CHOLHDL 3 07/23/2020 0901   VLDL 15.2 07/23/2020 0901   LDLCALC 69 07/23/2020 0901     Risk Assessment/Calculations:     HYPERTENSION CONTROL Vitals:   05/04/23 0826 05/04/23 0831  BP: (!) 140/72 (!) 142/72    The patient's blood pressure is elevated above target today.  In order to address the patient's elevated BP: Blood pressure will be monitored at home to determine if medication changes need to be made.            Physical Exam:    VS:  BP (!) 142/72 (BP Location: Right Arm, Patient Position: Sitting, Cuff Size: Normal)   Pulse (!) 58   Ht 5\' 9"  (1.753 m)   Wt 165 lb 9.6 oz (75.1 kg)   SpO2 95%   BMI 24.45 kg/m     Wt Readings from Last 3 Encounters:  05/04/23 165 lb 9.6 oz (75.1 kg)  02/23/23 163 lb 9.6 oz (74.2 kg)  02/09/23 163 lb 3.2 oz (74 kg)     GEN:  Well nourished, well developed in no acute distress HEENT: Normal NECK: No JVD; No carotid bruits CARDIAC: RRR, no murmurs, rubs, gallops RESPIRATORY: Bilateral wheezing noted ABDOMEN: Soft, non-tender, non-distended MUSCULOSKELETAL:  No edema; No deformity  SKIN: Warm and dry NEUROLOGIC:  Alert and oriented x 3 PSYCHIATRIC:  Normal affect   ASSESSMENT:    1. Paroxysmal atrial fibrillation (HCC)   2. Coronary artery calcification   3. Elevated BP without diagnosis of hypertension    PLAN:    In order of problems listed above:  Paroxysmal A-fib, in setting of respiratory distress.  CHA2DS2-VASc score 1.  Baseline bradycardia.  Place cardiac monitor to evaluate any  recurrence.  Okay for aspirin 81 mg daily, obtain echocardiogram.  Refer to A-fib clinic. LAD calcifications on chest CT, aspirin 81 as above, LDL at goal. BP elevated, usually controlled.  Monitor off BP meds.  Follow-up in 2 months after echo and cardiac monitor.      Medication Adjustments/Labs and Tests Ordered: Current medicines are reviewed at length with the patient today.  Concerns regarding medicines are outlined above.  Orders Placed This Encounter  Procedures   Ambulatory referral to Cardiac Electrophysiology   LONG TERM MONITOR (3-14 DAYS)   EKG 12-Lead   ECHOCARDIOGRAM COMPLETE   Meds ordered this encounter  Medications  aspirin EC 81 MG tablet    Sig: Take 1 tablet (81 mg total) by mouth daily. Swallow whole.    Patient Instructions  Medication Instructions:  Your physician has recommended you make the following change in your medication:   START - aspirin EC 81 MG tablet - Take 1 tablet (81 mg total) by mouth daily. Swallow whole  *If you need a refill on your cardiac medications before your next appointment, please call your pharmacy*   Lab Work: -None ordered  Testing/Procedures: Your physician has requested that you have an echocardiogram. Echocardiography is a painless test that uses sound waves to create images of your heart. It provides your doctor with information about the size and shape of your heart and how well your heart's chambers and valves are working. This procedure takes approximately one hour. There are no restrictions for this procedure. Please do NOT wear cologne, perfume, aftershave, or lotions (deodorant is allowed). Please arrive 15 minutes prior to your appointment time.    Heart Monitor:  Your physician has requested you wear a ZIO XT heart monitor for 14 days.  Your monitor will be mailed to your home address within 3-5 business days. This is sent via [employer] from [employer]. However, if you have not received your monitor  after 5 business days please send [place] a MyChart message or call the office at [phone], so we may follow up on this for you.   This monitor is a medical device (single patch monitor) that records the heart's electrical activity. Doctors most often use these monitors to diagnose arrhythmias. Arrhythmias are problems with the speed or rhythm of the heartbeat.   [employer] supplies 1 patch per enrollment. Additional stickers are not available.  Please DO NOT apply the patch if you will be having a Nuclear Stress Test, Echocardiogram, Cardiac CT, Cardiac MRI, Chest X-ray during the period you would be wearing the monitor. The patch cannot be worn during these tests.  You cannot remove and re-apply the ZIO patch monitor.   Applying the Monitor: Once you receive your monitor, this will include a small razor, abrader, and 4 alcohol pads. Shave hair from upper left chest Rub abrader disc in 40 strokes over the left upper chest as indicated in your monitor instructions Clean area with 4 enclosed alcohol pads (there may be a mild & brief stinging sensation over the newly abraded area, but this is normal). Let dry Apply patch as indicated in monitor instructions. Patch will be placed under collarbone on the left side of the chest with arrow pointing upward. Rub adhesive wings for 2 minutes. Remove white label marked "1". Remove the white label marked "2". Rub patch adhesive wings for an 2 minutes.  While looking in a mirror, press and release button in the center of the patch. You may hear a "click". A small green light will flash 4-6 times and then stop. This will be your indicator that the monitor has been turned on.  Wearing the Monitor: Avoid showering during the first 24 hours of wearing the monitor.  After 24 hours you may shower with the patch on. Take brief showers with your back facing the shower head.  Avoid excessive sweating to help maximize wear time. Do not submerge the device, no hot tubs,  and no swimming pools. Keep any lotions or oils away from the patch. Press the button if you feel a symptom. You will hear a small click. Record date, time, and symptoms in the  Patient Logbook or App.  Monitor Issues: Call [employer] Customer Care at [phone] if you have questions regarding your Zio Patch Monitor. Call them immediately if you see an orange/ amber colored light blinking on your monitor. If your monitor falls off and you cannot get this reapplied or if you need suggestions for securing your monitor call [employer] at [phone].   Returning the Monitor: Once you have completed wearing your monitor, follow instructions on the last 2 pages of the Patient Logbook. Stick monitor patch on to the last page of the Patient Logbook.  Place Patient Logbook with monitor in the return box provided. Use locking tab on box and tape box closed securely. The return box has pre-paid postage on it.  Place the return box in the regular [place] Mail box as soon as possible It will take anywhere from 1-2 weeks for your provider to receive and review your results once you mail this back. If for some reason you have misplaced your return box then call our office and we can provide another box and/or mail it off for you.   Billing  and Patient Assistance Program Information: We have supplied [employer] with any of your insurance information on file for billing purposes. [employer] offers a sliding scale Patient Assistance Program for patients that do not have insurance, or whose insurance does not completely cover the cost of the ZIO monitor. You must apply for the Patient Assistance Program to qualify for this discounted rate. To apply, please call [employer] at [phone], select option 1, ask to apply for the Patient Assistance Program. [employer] will ask your household income, and how many people are in your household. They will quote your out-of-pocket cost based on that information. [employer] will  also be able to set up for a 35-month, interest-free payment plan if needed.     Follow-Up: At [facility], you and your health needs are our priority.  As part of our continuing mission to provide you with exceptional heart care, we have created designated Provider Care Teams.  These Care Teams include your primary Cardiologist (physician) and Advanced Practice Providers (APPs -  Physician Assistants and Nurse Practitioners) who all work together to provide you with the care you need, when you need it.  We recommend signing up for the patient portal called "MyChart".  Sign up information is provided on this After Visit Summary.  MyChart is used to connect with patients for Virtual Visits (Telemedicine).  Patients are able to view lab/test results, encounter notes, upcoming appointments, etc.  Non-urgent messages can be sent to your provider as well.   To learn more about what you can do with MyChart, go to [email or web address].    Your next appointment:   2 - 3 month(s)  Provider:   Debbe Odea, MD    Other Instructions  Ambulatory referral to Cardiac Electrophysiology   Signed, Debbe Odea, MD  05/04/2023 9:37 AM    [facility]

## 2023-05-04 NOTE — Patient Instructions (Addendum)
Medication Instructions:  Your physician has recommended you make the following change in your medication:   START - aspirin EC 81 MG tablet - Take 1 tablet (81 mg total) by mouth daily. Swallow whole  *If you need a refill on your cardiac medications before your next appointment, please call your pharmacy*   Lab Work: -None ordered  Testing/Procedures: Your physician has requested that you have an echocardiogram. Echocardiography is a painless test that uses sound waves to create images of your heart. It provides your doctor with information about the size and shape of your heart and how well your heart's chambers and valves are working. This procedure takes approximately one hour. There are no restrictions for this procedure. Please do NOT wear cologne, perfume, aftershave, or lotions (deodorant is allowed). Please arrive 15 minutes prior to your appointment time.    Heart Monitor:  Your physician has requested you wear a ZIO XT heart monitor for 14 days.  Your monitor will be mailed to your home address within 3-5 business days. This is sent via [employer] Ex from [employer]. However, if you have not received your monitor after 5 business days please send [place] a MyChart message or call the office at [phone], so we may follow up on this for you.   This monitor is a medical device (single patch monitor) that records the heart's electrical activity. Doctors most often use these monitors to diagnose arrhythmias. Arrhythmias are problems with the speed or rhythm of the heartbeat.   [employer] supplies 1 patch per enrollment. Additional stickers are not available.  Please DO NOT apply the patch if you will be having a Nuclear Stress Test, Echocardiogram, Cardiac CT, Cardiac MRI, Chest X-ray during the period you would be wearing the monitor. The patch cannot be worn during these tests.  You cannot remove and re-apply the ZIO patch monitor.   Applying the Monitor: Once you receive your  monitor, this will include a small razor, abrader, and 4 alcohol pads. Shave hair from upper left chest Rub abrader disc in 40 strokes over the left upper chest as indicated in your monitor instructions Clean area with 4 enclosed alcohol pads (there may be a mild & brief stinging sensation over the newly abraded area, but this is normal). Let dry Apply patch as indicated in monitor instructions. Patch will be placed under collarbone on the left side of the chest with arrow pointing upward. Rub adhesive wings for 2 minutes. Remove white label marked "1". Remove the white label marked "2". Rub patch adhesive wings for an 2 minutes.  While looking in a mirror, press and release button in the center of the patch. You may hear a "click". A small green light will flash 4-6 times and then stop. This will be your indicator that the monitor has been turned on.  Wearing the Monitor: Avoid showering during the first 24 hours of wearing the monitor.  After 24 hours you may shower with the patch on. Take brief showers with your back facing the shower head.  Avoid excessive sweating to help maximize wear time. Do not submerge the device, no hot tubs, and no swimming pools. Keep any lotions or oils away from the patch. Press the button if you feel a symptom. You will hear a small click. Record date, time, and symptoms in the Patient Logbook or App.  Monitor Issues: Call [employer] Customer Care at [phone] if you have questions regarding your Zio Patch Monitor. Call them immediately if  you see an orange/ amber colored light blinking on your monitor. If your monitor falls off and you cannot get this reapplied or if you need suggestions for securing your monitor call [employer] at [phone].   Returning the Monitor: Once you have completed wearing your monitor, follow instructions on the last 2 pages of the Patient Logbook. Stick monitor patch on to the last page of the Patient Logbook.   Place Patient Logbook with monitor in the return box provided. Use locking tab on box and tape box closed securely. The return box has pre-paid postage on it.  Place the return box in the regular [place] Mail box as soon as possible It will take anywhere from 1-2 weeks for your provider to receive and review your results once you mail this back. If for some reason you have misplaced your return box then call our office and we can provide another box and/or mail it off for you.   Billing  and Patient Assistance Program Information: We have supplied [employer] with any of your insurance information on file for billing purposes. [employer] offers a sliding scale Patient Assistance Program for patients that do not have insurance, or whose insurance does not completely cover the cost of the ZIO monitor. You must apply for the Patient Assistance Program to qualify for this discounted rate. To apply, please call [employer] at [phone], select option 1, ask to apply for the Patient Assistance Program. [employer] will ask your household income, and how many people are in your household. They will quote your out-of-pocket cost based on that information. [employer] will also be able to set up for a 76-month, interest-free payment plan if needed.     Follow-Up: At [facility], you and your health needs are our priority.  As part of our continuing mission to provide you with exceptional heart care, we have created designated Provider Care Teams.  These Care Teams include your primary Cardiologist (physician) and Advanced Practice Providers (APPs -  Physician Assistants and Nurse Practitioners) who all work together to provide you with the care you need, when you need it.  We recommend signing up for the patient portal called "MyChart".  Sign up information is provided on this After Visit Summary.  MyChart is used to connect with patients for Virtual Visits (Telemedicine).  Patients are able to view lab/test results,  encounter notes, upcoming appointments, etc.  Non-urgent messages can be sent to your provider as well.   To learn more about what you can do with MyChart, go to [email or web address].    Your next appointment:   2 - 3 month(s)  Provider:   Debbe Odea, MD    Other Instructions  Ambulatory referral to Cardiac Electrophysiology

## 2023-05-07 DIAGNOSIS — I480 Paroxysmal atrial fibrillation: Secondary | ICD-10-CM

## 2023-05-24 ENCOUNTER — Ambulatory Visit: Payer: No Typology Code available for payment source | Attending: Cardiology

## 2023-05-24 DIAGNOSIS — I480 Paroxysmal atrial fibrillation: Secondary | ICD-10-CM

## 2023-05-24 LAB — ECHOCARDIOGRAM COMPLETE
Area-P 1/2: 3.08 cm2
S' Lateral: 1.8 cm

## 2023-05-25 ENCOUNTER — Ambulatory Visit: Payer: No Typology Code available for payment source | Admitting: Pulmonary Disease

## 2023-05-25 ENCOUNTER — Encounter: Payer: Self-pay | Admitting: Pulmonary Disease

## 2023-05-25 VITALS — BP 140/80 | HR 68 | Ht 68.5 in | Wt 163.8 lb

## 2023-05-25 DIAGNOSIS — J4489 Other specified chronic obstructive pulmonary disease: Secondary | ICD-10-CM

## 2023-05-25 DIAGNOSIS — J40 Bronchitis, not specified as acute or chronic: Secondary | ICD-10-CM

## 2023-05-25 LAB — CBC WITH DIFFERENTIAL/PLATELET
Basophils Absolute: 0.1 10*3/uL (ref 0.0–0.1)
Basophils Relative: 0.8 % (ref 0.0–3.0)
Eosinophils Absolute: 0.6 10*3/uL (ref 0.0–0.7)
Eosinophils Relative: 7.6 % — ABNORMAL HIGH (ref 0.0–5.0)
HCT: 43 % (ref 39.0–52.0)
Hemoglobin: 13.4 g/dL (ref 13.0–17.0)
Lymphocytes Relative: 22.4 % (ref 12.0–46.0)
Lymphs Abs: 1.6 10*3/uL (ref 0.7–4.0)
MCHC: 31.3 g/dL (ref 30.0–36.0)
MCV: 84.2 fl (ref 78.0–100.0)
Monocytes Absolute: 0.7 10*3/uL (ref 0.1–1.0)
Monocytes Relative: 9.4 % (ref 3.0–12.0)
Neutro Abs: 4.4 10*3/uL (ref 1.4–7.7)
Neutrophils Relative %: 59.8 % (ref 43.0–77.0)
Platelets: 259 10*3/uL (ref 150.0–400.0)
RBC: 5.11 Mil/uL (ref 4.22–5.81)
RDW: 13.2 % (ref 11.5–15.5)
WBC: 7.3 10*3/uL (ref 4.0–10.5)

## 2023-05-25 LAB — PULMONARY FUNCTION TEST
DL/VA % pred: 115 %
DL/VA: 4.72 ml/min/mmHg/L
DLCO cor % pred: 88 %
DLCO cor: 21.82 ml/min/mmHg
DLCO unc % pred: 88 %
DLCO unc: 21.82 ml/min/mmHg
FEF 25-75 Post: 0.67 L/sec
FEF 25-75 Pre: 0.73 L/sec
FEF2575-%Change-Post: -9 %
FEF2575-%Pred-Post: 28 %
FEF2575-%Pred-Pre: 31 %
FEV1-%Change-Post: 0 %
FEV1-%Pred-Post: 48 %
FEV1-%Pred-Pre: 48 %
FEV1-Post: 1.47 L
FEV1-Pre: 1.48 L
FEV1FVC-%Change-Post: -3 %
FEV1FVC-%Pred-Pre: 75 %
FEV6-%Change-Post: 5 %
FEV6-%Pred-Post: 69 %
FEV6-%Pred-Pre: 65 %
FEV6-Post: 2.72 L
FEV6-Pre: 2.59 L
FEV6FVC-%Change-Post: 1 %
FEV6FVC-%Pred-Post: 105 %
FEV6FVC-%Pred-Pre: 103 %
FVC-%Change-Post: 3 %
FVC-%Pred-Post: 65 %
FVC-%Pred-Pre: 63 %
FVC-Post: 2.75 L
FVC-Pre: 2.66 L
Post FEV1/FVC ratio: 54 %
Post FEV6/FVC ratio: 99 %
Pre FEV1/FVC ratio: 55 %
Pre FEV6/FVC Ratio: 97 %
RV % pred: 165 %
RV: 3.91 L
TLC % pred: 94 %
TLC: 6.38 L

## 2023-05-25 MED ORDER — BREZTRI AEROSPHERE 160-9-4.8 MCG/ACT IN AERO: 2.0000 | INHALATION_SPRAY | Freq: Two times a day (BID) | RESPIRATORY_TRACT | Status: DC

## 2023-05-25 NOTE — Patient Instructions (Signed)
Full PFT performed today. °

## 2023-05-25 NOTE — Patient Instructions (Signed)
Start breztri inhaler 2 puffs twice daily - rinse mouth out after each use Let us know if you would like prescription of this inhaler  Stop using advair inhaler while using breztri inhaler  Start fluticasone nasal spray, 1 spray per nostril daily  Start ipratropium nasal spray, 2 sprays per nostril twice daily  Nice work on quitting smoking!  Your breathing tests show evidence of COPD  We will check lab work today  Follow up in 4 months

## 2023-05-25 NOTE — Progress Notes (Signed)
Synopsis: Referred in June 2024 for hospital follow up for acute hypoxemic respiratory failure  Subjective:   PATIENT ID: Ronnie Palmer GENDER: male DOB: 1951/05/31, MRN: [id number]  HPI  Chief Complaint  Patient presents with   Follow-up    Pt is here for PFT and F/U to discuss results.   Ronnie Palmer is a 72 year old male, recent former smoker who returns to pulmonary clinic for COPD.  Initial OV 02/09/23 He was admitted 6/6 to 6/8 for acute hypoxemic respiratory failure due to possible asthma/COPD exacerbation and paroxysmal atrial fibrillation. He was treated with prednisone and Levaquin. Started on Cardizem and daily aspirin. Prior to this admission he was seen in the ER on 4/7 for cough.   No issues with GERD symptoms. No seasonal allergies. No sinus congestion or post nasal drainage. He has night time awakenings due to cough. No fevers, chills or sweats. His appetite is down. His weight is down a bit. No prior episodes like this in the past with his cough and breathing. He was active prior to developing these symptoms, he would walk for 1 hour in the morning and in the evenings.   Absolute eosinophil count was 300 on 02/03/23  He has been smoking 0.5ppd for 50+ years. He has quit smoking for now. He worked as a Patent examiner. No other dust or chemical exposures. He is accompanied by his daughter.  Today OV 05/25/23 Patient has quit smoking since last visit. The prolonged steroid taper initially helped with his symptoms but once complete continued to have issues with post-nasal drip, cough and dyspnea. Advair inhaler does not seem to help much. The patient's symptoms are particularly bothersome at night, disrupting sleep. The patient denies any heartburn or reflux symptoms.   Past Medical History:  Diagnosis Date   Paroxysmal atrial fibrillation (HCC)      No family history on file.   Social History   Socioeconomic History   Marital status: Married     Spouse name: Not on file   Number of children: Not on file   Years of education: Not on file   Highest education level: Not on file  Occupational History   Not on file  Tobacco Use   Smoking status: Former    Current packs/day: 0.00    Average packs/day: 0.3 packs/day for 40.0 years (10.0 ttl pk-yrs)    Types: Cigarettes    Start date: 12/17/1982    Quit date: 12/17/2022    Years since quitting: 0.4   Smokeless tobacco: Never   Tobacco comments:    Has not smoked since sick  Vaping Use   Vaping status: Never Used  Substance and Sexual Activity   Alcohol use: Yes    Comment: social drinker   Drug use: Never   Sexual activity: Yes  Other Topics Concern   Not on file  Social History Narrative   ** Merged History Encounter **       Social Determinants of Health   Financial Resource Strain: Not on file  Food Insecurity: Not on file  Transportation Needs: Not on file  Physical Activity: Not on file  Stress: Not on file  Social Connections: Not on file  Intimate Partner Violence: Not on file     No Known Allergies   Outpatient Medications Prior to Visit  Medication Sig Dispense Refill   Acetaminophen (TYLENOL PO) Take 2 tablets by mouth 2 (two) times daily as needed (pain, fever, headache).     aspirin EC 81  MG tablet Take 1 tablet (81 mg total) by mouth daily. Swallow whole.     fluticasone (FLONASE) 50 MCG/ACT nasal spray Place 2 sprays into both nostrils daily. 16 g 0   fluticasone-salmeterol (WIXELA INHUB) 250-50 MCG/ACT AEPB Inhale 1 puff into the lungs in the morning and at bedtime. 60 each 5   ipratropium-albuterol (DUONEB) 0.5-2.5 (3) MG/3ML SOLN Take 3 mLs by nebulization every 6 (six) hours as needed.     No facility-administered medications prior to visit.   Review of Systems  Constitutional:  Negative for chills, fever, malaise/fatigue and weight loss.  HENT:  Negative for congestion, sinus pain and sore throat.   Eyes: Negative.   Respiratory:  Positive  for cough, shortness of breath and wheezing. Negative for hemoptysis and sputum production.   Cardiovascular:  Positive for palpitations. Negative for chest pain, orthopnea, claudication and leg swelling.  Gastrointestinal:  Negative for abdominal pain, heartburn, nausea and vomiting.  Genitourinary: Negative.   Musculoskeletal:  Negative for joint pain and myalgias.  Skin:  Negative for rash.  Neurological:  Negative for weakness.  Endo/Heme/Allergies: Negative.   Psychiatric/Behavioral: Negative.     Objective:   Vitals:   05/25/23 1031  BP: (!) 140/80  Pulse: 68  SpO2: 96%  Weight: 163 lb 12.8 oz (74.3 kg)  Height: 5' 8.5" (1.74 m)    Physical Exam Constitutional:      General: He is not in acute distress. HENT:     Head: Normocephalic and atraumatic.  Eyes:     Conjunctiva/sclera: Conjunctivae normal.  Cardiovascular:     Rate and Rhythm: Normal rate and regular rhythm.     Pulses: Normal pulses.     Heart sounds: Normal heart sounds. No murmur heard. Musculoskeletal:     Right lower leg: No edema.     Left lower leg: No edema.  Skin:    General: Skin is warm and dry.  Neurological:     General: No focal deficit present.     Mental Status: He is alert.    CBC    Component Value Date/Time   WBC 9.8 02/23/2023 1137   RBC 4.68 02/23/2023 1137   HGB 12.6 (L) 02/23/2023 1137   HCT 40.0 02/23/2023 1137   PLT 321.0 02/23/2023 1137   MCV 85.5 02/23/2023 1137   MCH 26.8 02/04/2023 0204   MCHC 31.5 02/23/2023 1137   RDW 13.6 02/23/2023 1137   LYMPHSABS 1.5 02/03/2023 1320   MONOABS 0.3 02/03/2023 1320   EOSABS 0.3 02/03/2023 1320   BASOSABS 0.1 02/03/2023 1320      Latest Ref Rng & Units 02/23/2023   11:37 AM 02/04/2023    2:04 AM 02/03/2023    1:26 PM  BMP  Glucose 70 - 99 mg/dL 88  782    BUN 6 - 23 mg/dL 26  27    Creatinine 9.56 - 1.50 mg/dL 2.13  0.86    Sodium 578 - 145 mEq/L 140  139  141   Potassium 3.5 - 5.1 mEq/L 3.4  3.8  3.6   Chloride 96 - 112  mEq/L 103  103    CO2 19 - 32 mEq/L 28  24    Calcium 8.4 - 10.5 mg/dL 9.5  9.0     Chest imaging: CTA Chest 02/03/23 Cardiovascular: Satisfactory opacification of the pulmonary arteries to the segmental level. No evidence of pulmonary embolism. Normal heart size. No pericardial effusion. There are atherosclerotic calcifications of the aorta.   Mediastinum/Nodes: No enlarged mediastinal,  hilar, or axillary lymph nodes. Thyroid gland, trachea, and esophagus demonstrate no significant findings.   Lungs/Pleura: Lungs are clear. No pleural effusion or pneumothorax.  PFT:    Latest Ref Rng & Units 05/25/2023    8:35 AM  PFT Results  FVC-Pre L 2.66  P  FVC-Predicted Pre % 63  P  FVC-Post L 2.75  P  FVC-Predicted Post % 65  P  Pre FEV1/FVC % % 55  P  Post FEV1/FCV % % 54  P  FEV1-Pre L 1.48  P  FEV1-Predicted Pre % 48  P  FEV1-Post L 1.47  P  DLCO uncorrected ml/min/mmHg 21.82  P  DLCO UNC% % 88  P  DLCO corrected ml/min/mmHg 21.82  P  DLCO COR %Predicted % 88  P  DLVA Predicted % 115  P  TLC L 6.38  P  TLC % Predicted % 94  P  RV % Predicted % 165  P    P Preliminary result   Labs:  Path:  Echo:  Heart Catheterization:    Assessment & Plan:   COPD with chronic bronchitis - Plan: IgE, CBC with Differential  Discussion: Ronnie Palmer is a 72 year old male, recent former smoker who is referred to pulmonary clinic for COPD.  Chronic Obstructive Pulmonary Disease (COPD) Persistent cough, wheezing, and nocturnal symptoms despite moderate dose steroid inhaler (Advair). Slight improvement with recent steroid taper. Elevated eosinophil count suggesting allergic inflammation. Patient quit smoking nine months ago, which may contribute to increased cough and sputum production due to ciliary recovery. -Discontinue Advair inhaler. -Trial Breztri inhaler, two puffs in the morning and two puffs in the evening. If improvement noted, send message through MyChart for  prescription. -Consider future transition to biologic injectable medication if frequent need for prednisone or prednisone tapers persists. -Order blood tests to check eosinophil and IgE levels.  Post-nasal Drip Cough worsens with change in position, suggestive of post-nasal drip. -Resume Flonase nasal spray, one spray per nostril daily for at least one month. -Add Ipratropium nasal spray, two sprays per nostril twice daily. Monitor for potential nose bleeding.  Follow up in 4 months  Melody Comas, MD [facility] Office: [phone]   Current Outpatient Medications:    Acetaminophen (TYLENOL PO), Take 2 tablets by mouth 2 (two) times daily as needed (pain, fever, headache)., Disp: , Rfl:    aspirin EC 81 MG tablet, Take 1 tablet (81 mg total) by mouth daily. Swallow whole., Disp: , Rfl:    fluticasone (FLONASE) 50 MCG/ACT nasal spray, Place 2 sprays into both nostrils daily., Disp: 16 g, Rfl: 0   fluticasone-salmeterol (WIXELA INHUB) 250-50 MCG/ACT AEPB, Inhale 1 puff into the lungs in the morning and at bedtime., Disp: 60 each, Rfl: 5   ipratropium-albuterol (DUONEB) 0.5-2.5 (3) MG/3ML SOLN, Take 3 mLs by nebulization every 6 (six) hours as needed., Disp: , Rfl:

## 2023-05-25 NOTE — Progress Notes (Signed)
Full PFT performed today. °

## 2023-05-26 ENCOUNTER — Encounter: Payer: Self-pay | Admitting: Nurse Practitioner

## 2023-05-26 ENCOUNTER — Ambulatory Visit: Payer: No Typology Code available for payment source | Admitting: Nurse Practitioner

## 2023-05-26 VITALS — BP 118/80 | HR 69 | Temp 97.7°F | Ht 68.0 in | Wt 164.6 lb

## 2023-05-26 DIAGNOSIS — J410 Simple chronic bronchitis: Secondary | ICD-10-CM | POA: Diagnosis not present

## 2023-05-26 DIAGNOSIS — Z1211 Encounter for screening for malignant neoplasm of colon: Secondary | ICD-10-CM | POA: Diagnosis not present

## 2023-05-26 DIAGNOSIS — Z0000 Encounter for general adult medical examination without abnormal findings: Secondary | ICD-10-CM

## 2023-05-26 DIAGNOSIS — Z125 Encounter for screening for malignant neoplasm of prostate: Secondary | ICD-10-CM | POA: Diagnosis not present

## 2023-05-26 DIAGNOSIS — E7841 Elevated Lipoprotein(a): Secondary | ICD-10-CM | POA: Diagnosis not present

## 2023-05-26 DIAGNOSIS — Z87891 Personal history of nicotine dependence: Secondary | ICD-10-CM

## 2023-05-26 DIAGNOSIS — Z23 Encounter for immunization: Secondary | ICD-10-CM

## 2023-05-26 DIAGNOSIS — I480 Paroxysmal atrial fibrillation: Secondary | ICD-10-CM | POA: Diagnosis not present

## 2023-05-26 LAB — COMPREHENSIVE METABOLIC PANEL
ALT: 23 U/L (ref 0–53)
AST: 23 U/L (ref 0–37)
Albumin: 3.8 g/dL (ref 3.5–5.2)
Alkaline Phosphatase: 87 U/L (ref 39–117)
BUN: 18 mg/dL (ref 6–23)
CO2: 29 mEq/L (ref 19–32)
Calcium: 9.3 mg/dL (ref 8.4–10.5)
Chloride: 103 mEq/L (ref 96–112)
Creatinine, Ser: 0.75 mg/dL (ref 0.40–1.50)
GFR: 91.14 mL/min (ref >60.00)
Glucose, Bld: 99 mg/dL (ref 70–99)
Potassium: 3.6 mEq/L (ref 3.5–5.1)
Sodium: 139 mEq/L (ref 135–145)
Total Bilirubin: 0.3 mg/dL (ref 0.2–1.2)
Total Protein: 7.4 g/dL (ref 6.0–8.3)

## 2023-05-26 LAB — LIPID PANEL
Cholesterol: 174 mg/dL (ref 0–200)
HDL: 49.9 mg/dL (ref >39.00)
LDL Cholesterol: 90 mg/dL (ref 0–99)
NonHDL: 123.63
Total CHOL/HDL Ratio: 3
Triglycerides: 170 mg/dL — ABNORMAL HIGH (ref 0.0–149.0)
VLDL: 34 mg/dL (ref 0.0–40.0)

## 2023-05-26 LAB — CBC
HCT: 40.2 % (ref 39.0–52.0)
Hemoglobin: 12.6 g/dL — ABNORMAL LOW (ref 13.0–17.0)
MCHC: 31.4 g/dL (ref 30.0–36.0)
MCV: 83.5 fl (ref 78.0–100.0)
Platelets: 252 10*3/uL (ref 150.0–400.0)
RBC: 4.81 Mil/uL (ref 4.22–5.81)
RDW: 13.5 % (ref 11.5–15.5)
WBC: 5.3 10*3/uL (ref 4.0–10.5)

## 2023-05-26 LAB — URINALYSIS, MICROSCOPIC ONLY
RBC / HPF: NONE SEEN (ref 0–?)
WBC, UA: NONE SEEN (ref 0–?)

## 2023-05-26 LAB — TSH: TSH: 0.36 u[IU]/mL (ref 0.35–5.50)

## 2023-05-26 LAB — IGE: IgE (Immunoglobulin E), Serum: 1068 kU/L — ABNORMAL HIGH (ref <=114)

## 2023-05-26 LAB — PSA, MEDICARE: PSA: 1.2 ng/ml (ref 0.10–4.00)

## 2023-05-26 MED ORDER — CLOTRIMAZOLE-BETAMETHASONE 1-0.05 % EX CREA
1.0000 | TOPICAL_CREAM | Freq: Every day | CUTANEOUS | 0 refills | Status: DC
Start: 2023-05-26 — End: 2024-06-25

## 2023-05-26 NOTE — Progress Notes (Addendum)
Established Patient Office Visit  Subjective   Patient ID: Ronnie Palmer, male    DOB: 07/25/1951  Age: 72 y.o. MRN: [id number]  Chief Complaint  Patient presents with   Annual Exam    HPI  for complete physical and follow up of chronic conditions.   Afib: patient has seen cardiology and was referred to Afib clinic. Ordered an echo and heart monitor. Continued daily ASA. Discontinued the cardizem  COPD: patient is followed by Dr. Melody Comas through pulmonology. Patient was switched from Advair to [facility]. States that he still has a coughing fits. States that he will have episodes that will wake him. Was seen yesterday   Immunizations: -Tetanus: Completed in unsure -Influenza: 05/21/2023 -Shingles: Get at local pharmacy -Pneumonia: needs updating today  Diet: Fair diet. States that he does 1 big meal and snacks. States that he will do coffee in the morning and then water  Exercise:  Walks the neighborhood daily  Eye exam: Completes annually. Glasses   Dental exam: Completes semi-annually    Colonoscopy: Has never done.  Will do Cologuard today Lung Cancer Screening: Does qualify but recently had a CTA of chest showed no pulmonary nodules  PSA: Due  Sleep: states he will go to bed around 9-5am. Feels rested sometimes. Does snore       Review of Systems  Constitutional:  Negative for chills and fever.  Respiratory:  Positive for shortness of breath.   Cardiovascular:  Negative for chest pain and leg swelling.  Gastrointestinal:  Negative for abdominal pain, blood in stool, constipation, diarrhea, nausea and vomiting.       BM daily   Genitourinary:  Negative for dysuria and hematuria.       Nocturia sometimes  Neurological:  Negative for tingling and headaches.  Psychiatric/Behavioral:  Negative for hallucinations and suicidal ideas.       Objective:     BP 118/80   Pulse 69   Temp 97.7 F (36.5 C) (Temporal)   Ht 5\' 8"  (1.727 m)   Wt 164 lb  9.6 oz (74.7 kg)   SpO2 94%   BMI 25.03 kg/m  BP Readings from Last 3 Encounters:  05/26/23 118/80  05/25/23 (!) 140/80  05/04/23 (!) 142/72   Wt Readings from Last 3 Encounters:  05/26/23 164 lb 9.6 oz (74.7 kg)  05/25/23 163 lb 12.8 oz (74.3 kg)  05/04/23 165 lb 9.6 oz (75.1 kg)      Physical Exam Vitals and nursing note reviewed.  Constitutional:      Appearance: Normal appearance.  HENT:     Right Ear: Tympanic membrane, ear canal and external ear normal.     Left Ear: Tympanic membrane, ear canal and external ear normal.     Mouth/Throat:     Mouth: Mucous membranes are moist.     Pharynx: Oropharynx is clear.  Eyes:     Extraocular Movements: Extraocular movements intact.     Pupils: Pupils are equal, round, and reactive to light.  Cardiovascular:     Rate and Rhythm: Normal rate and regular rhythm.     Pulses: Normal pulses.     Heart sounds: Normal heart sounds.  Pulmonary:     Effort: Pulmonary effort is normal.     Breath sounds: Wheezing (LLL) present.  Abdominal:     General: Bowel sounds are normal. There is no distension.     Palpations: There is no mass.     Tenderness: There is no abdominal tenderness.  Hernia: No hernia is present.  Musculoskeletal:     Right lower leg: No edema.     Left lower leg: No edema.  Lymphadenopathy:     Cervical: No cervical adenopathy.  Skin:    General: Skin is warm.  Neurological:     General: No focal deficit present.     Mental Status: He is alert.     Deep Tendon Reflexes:     Reflex Scores:      Bicep reflexes are 2+ on the right side and 2+ on the left side.      Patellar reflexes are 2+ on the right side and 2+ on the left side.    Comments: Bilateral upper and lower extremity strength 5/5  Psychiatric:        Mood and Affect: Mood normal.        Behavior: Behavior normal.        Thought Content: Thought content normal.        Judgment: Judgment normal.      No results found for any visits on  05/26/23.    The 10-year ASCVD risk score (Arnett DK, et al., 2019) is: 12.7%    Assessment & Plan:   Problem List Items Addressed This Visit       Cardiovascular and Mediastinum   Paroxysmal atrial fibrillation [facility])    Patient has been seen and evaluated by cardiology.  Currently on ASA only heart within regular rate and rhythm today in office per auscultation.  Continue following up with cardiology and EP as recommended      Relevant Orders   TSH     Respiratory   Simple chronic bronchitis (HCC)    Patient was evaluated by Dr. Melody Comas, pulmonologist.  Switch to [facility] inhaler took first dose today.  Continue taking medication as prescribed continue following up with specialist as recommended.        Other   Elevated lipoprotein(a)    History of elevated cholesterol pending lipid panel today.      Relevant Orders   Lipid panel   Preventative health care - Primary    Discussed age-appropriate immunizations and screening exams.  Did review patient's personal, surgical, social, family histories.  Patient is up-to-date on all age-appropriate vaccinations he would like.  Will update pneumonia vaccine today in office (Prevnar 20).  Ordered Cologuard for CRC screening.  PSA for prostate cancer screening.  Patient was given information at discharge about preventative healthcare maintenance with anticipatory guidance.      Relevant Orders   CBC   Comprehensive metabolic panel   TSH   Former smoker    History of smoking.  Check urine microscopy to rule out microscopic hematuria      Relevant Orders   Urine Microscopic   Other Visit Diagnoses     Screening for prostate cancer       Relevant Orders   PSA, Medicare   Screening for colon cancer       Relevant Orders   Cologuard   Need for pneumococcal 20-valent conjugate vaccination       Relevant Orders   Pneumococcal conjugate vaccine 20-valent (Prevnar 20) (Completed)       Return in about 1 year (around  05/25/2024) for CPE and Labs.    Audria Nine, NP

## 2023-05-26 NOTE — Assessment & Plan Note (Signed)
History of elevated cholesterol pending lipid panel today.

## 2023-05-26 NOTE — Assessment & Plan Note (Signed)
Discussed age-appropriate immunizations and screening exams.  Did review patient's personal, surgical, social, family histories.  Patient is up-to-date on all age-appropriate vaccinations he would like.  Will update pneumonia vaccine today in office (Prevnar 20).  Ordered Cologuard for CRC screening.  PSA for prostate cancer screening.  Patient was given information at discharge about preventative healthcare maintenance with anticipatory guidance.

## 2023-05-26 NOTE — Assessment & Plan Note (Signed)
Patient has been seen and evaluated by cardiology.  Currently on ASA only heart within regular rate and rhythm today in office per auscultation.  Continue following up with cardiology and EP as recommended

## 2023-05-26 NOTE — Assessment & Plan Note (Signed)
Patient was evaluated by Dr. Melody Comas, pulmonologist.  Switch to [facility] inhaler took first dose today.  Continue taking medication as prescribed continue following up with specialist as recommended.

## 2023-05-26 NOTE — Assessment & Plan Note (Signed)
History of smoking.  Check urine microscopy to rule out microscopic hematuria

## 2023-05-26 NOTE — Patient Instructions (Signed)
Nice to see you today I will be in touch with the labs once I have them Follow up with me in 1 year, sooner if you need

## 2023-05-27 ENCOUNTER — Telehealth: Payer: Self-pay | Admitting: Pulmonary Disease

## 2023-05-27 ENCOUNTER — Encounter: Payer: Self-pay | Admitting: Pulmonary Disease

## 2023-05-27 DIAGNOSIS — J4489 Other specified chronic obstructive pulmonary disease: Secondary | ICD-10-CM

## 2023-05-27 NOTE — Telephone Encounter (Signed)
Lm x1 for patient.  

## 2023-05-27 NOTE — Telephone Encounter (Signed)
Patient has elevated IgE and eosinophil level. Please schedule patient with [facility] to work on starting dupixent therapy for asthma-copd overlap syndrome.   I will sending the patient a message through mychart with this information.  Thanks, JD

## 2023-06-01 NOTE — Telephone Encounter (Signed)
Called the pt via [employer]- there was no answer- had to [facility]    Son Jorja Loa is on [place]- called him and left him a msg to call back   Will hold and then send letter if no reply by phone

## 2023-06-03 NOTE — Telephone Encounter (Signed)
Misty Stanley daughter is returning phone call. Misty Stanley phone number is [phone].

## 2023-06-07 DIAGNOSIS — Z1211 Encounter for screening for malignant neoplasm of colon: Secondary | ICD-10-CM | POA: Diagnosis not present

## 2023-06-14 ENCOUNTER — Telehealth: Payer: Self-pay | Admitting: Pharmacist

## 2023-06-14 ENCOUNTER — Other Ambulatory Visit: Payer: No Typology Code available for payment source | Admitting: Pharmacist

## 2023-06-14 NOTE — Progress Notes (Deleted)
HPI Patient presents today to [facility] to see pharmacy team for Dupixent new start. Past medical history includes ***  He is accompanied by [name] interpreter Last seen by Dr. Francine Graven on 05/25/2023. Reported nighttime awakenings which prolonged prednisone taper finally helped improve but did not fully resolve  Quit smoking? ***  Respiratory Medications Current regimen: *** Tried in past: *** Patient reports {Adherence challenges yes no:3044014::"adherence challenges","no known adherence challenges"}  OBJECTIVE No Known Allergies  Outpatient Encounter Medications as of 06/14/2023  Medication Sig   Acetaminophen (TYLENOL PO) Take 2 tablets by mouth 2 (two) times daily as needed (pain, fever, headache).   aspirin EC 81 MG tablet Take 1 tablet (81 mg total) by mouth daily. Swallow whole.   Budeson-Glycopyrrol-Formoterol (BREZTRI AEROSPHERE) 160-9-4.8 MCG/ACT AERO Inhale 2 puffs into the lungs in the morning and at bedtime.   clotrimazole-betamethasone (LOTRISONE) cream Apply 1 Application topically daily.   fluticasone (FLONASE) 50 MCG/ACT nasal spray Place 2 sprays into both nostrils daily.   fluticasone-salmeterol (WIXELA INHUB) 250-50 MCG/ACT AEPB Inhale 1 puff into the lungs in the morning and at bedtime.   ipratropium-albuterol (DUONEB) 0.5-2.5 (3) MG/3ML SOLN Take 3 mLs by nebulization every 6 (six) hours as needed.   No facility-administered encounter medications on file as of 06/14/2023.     Immunization History  Administered Date(s) Administered   Influenza, High Dose Seasonal PF 05/21/2023   PFIZER(Purple Top)SARS-COV-2 Vaccination 10/26/2019, 11/24/2019, 07/16/2020   PNEUMOCOCCAL CONJUGATE-20 05/26/2023     PFTs    Latest Ref Rng & Units 05/25/2023    8:35 AM  PFT Results  FVC-Pre L 2.66   FVC-Predicted Pre % 63   FVC-Post L 2.75   FVC-Predicted Post % 65   Pre FEV1/FVC % % 55   Post FEV1/FCV % % 54   FEV1-Pre L 1.48   FEV1-Predicted Pre % 48    FEV1-Post L 1.47   DLCO uncorrected ml/min/mmHg 21.82   DLCO UNC% % 88   DLCO corrected ml/min/mmHg 21.82   DLCO COR %Predicted % 88   DLVA Predicted % 115   TLC L 6.38   TLC % Predicted % 94   RV % Predicted % 165      Eosinophils Most recent blood eosinophil count was *** cells/microL taken on ***.   IgE: *** on ***   Assessment   Biologics training for dupilumab (Dupixent)  Goals of therapy: Mechanism: human monoclonal IgG4 antibody that inhibits interleukin-4 and interleukin-13 cytokine-induced responses, including release of proinflammatory cytokines, chemokines, and IgE Reviewed that Dupixent is add-on medication and patient must continue maintenance inhaler regimen. Response to therapy: may take 4 months to determine efficacy. Discussed that patients generally feel improvement sooner than 4 months.  Side effects: injection site reaction (6-18%), antibody development (5-16%), ophthalmic conjunctivitis (2-16%), transient blood eosinophilia (1-2%)  Dose: 600mg  at Week 0 (administered today in clinic) followed by 300mg  every 14 days thereafter  Administration/Storage:  Reviewed administration sites of thigh or abdomen (at least 2-3 inches away from abdomen). Reviewed the upper arm is only appropriate if caregiver is administering injection  Do not shake pen/syringe as this could lead to product foaming or precipitation. Do not use if solution is discolored or contains particulate matter or if window on prefilled pen is yellow (indicates pen has been used).  Reviewed storage of medication in refrigerator. Reviewed that Dupixent can be stored at room temperature in unopened carton for up to 14 days.  Medication Reconciliation  A drug regimen assessment was  performed, including review of allergies, interactions, disease-state management, dosing and immunization history. Medications were reviewed with the patient, including name, instructions, indication, goals of therapy,  potential side effects, importance of adherence, and safe use.  Drug interaction(s): none noted  Immunizations  Patient is indicated for the influenzae, pneumonia, and shingles vaccinations. Patient has received *** COVID19 vaccines.  PLAN Will start Dupixent benefits investigation. Patient assistance application completed by patient. Provider portion will need to be signed by Dr. Francine Graven Continue maintenance asthma regimen of: Breztri 160-9-4.25mcg (2 puffs twice daily)  All questions encouraged and answered.  Instructed patient to reach out with any further questions or concerns.  Thank you for allowing pharmacy to participate in this patient's care.  This appointment required *** minutes of patient care (this includes precharting, chart review, review of results, face-to-face care, etc.).   Chesley Mires, PharmD, MPH, BCPS, CPP Clinical Pharmacist (Rheumatology and Pulmonology)

## 2023-06-14 NOTE — Telephone Encounter (Signed)
Patient no-show to appt today.  I had reached out to patient's daughter Misty Stanley this morning from my other clinic to inquire if patient was interested in having a video or phone visit instead so he did not have to drive to clinic. Would not be starting Dupixent today but discussing and completing paperwork which can be coordinated without him coming on site. Did not receive return call from pt  Chesley Mires, PharmD, MPH, BCPS, CPP Clinical Pharmacist (Rheumatology and Pulmonology)

## 2023-06-15 LAB — COLOGUARD: COLOGUARD: POSITIVE — AB

## 2023-06-17 ENCOUNTER — Telehealth: Payer: Self-pay | Admitting: Nurse Practitioner

## 2023-06-17 DIAGNOSIS — R195 Other fecal abnormalities: Secondary | ICD-10-CM

## 2023-06-17 NOTE — Telephone Encounter (Signed)
-----   Message from [facility] T sent at 06/16/2023  4:47 PM EDT ----- Called patient, spoke with daughter per DPR. reviewed all information and repeated back to me. Pt's daughter would like the [place] location. Pt daughter also stated to make sure their office calls her and not the pt @ [phone].

## 2023-06-17 NOTE — Telephone Encounter (Signed)
Referral placed.

## 2023-06-20 ENCOUNTER — Ambulatory Visit: Payer: No Typology Code available for payment source | Admitting: Pharmacist

## 2023-06-20 ENCOUNTER — Telehealth: Payer: Self-pay | Admitting: Pharmacist

## 2023-06-20 DIAGNOSIS — J4489 Other specified chronic obstructive pulmonary disease: Secondary | ICD-10-CM

## 2023-06-20 DIAGNOSIS — Z7189 Other specified counseling: Secondary | ICD-10-CM

## 2023-06-20 MED ORDER — BREZTRI AEROSPHERE 160-9-4.8 MCG/ACT IN AERO
2.0000 | INHALATION_SPRAY | Freq: Two times a day (BID) | RESPIRATORY_TRACT | 5 refills | Status: DC
Start: 2023-06-20 — End: 2023-08-08

## 2023-06-20 NOTE — Telephone Encounter (Signed)
Breztri pt assistance application completed today and faxed to [employer]  Fax: [phone] Phone: [phone]  Chesley Mires, PharmD, MPH, BCPS, CPP Clinical Pharmacist (Rheumatology and Pulmonology)

## 2023-06-20 NOTE — Progress Notes (Signed)
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  He presents with Jorja Loa, his son, as interpreter. Last seen by Dr. Francine Graven on 05/25/23 - was started on Breztri  Respiratory Medications Current regimen: Breztri 160-9-4.75mcg (2 puffs twice daily) Tried in past: Wixela Patient reports no known adherence challenges  OBJECTIVE No Known Allergies  Outpatient Encounter Medications as of 06/20/2023  Medication Sig   Acetaminophen (TYLENOL PO) Take 2 tablets by mouth 2 (two) times daily as needed (pain, fever, headache).   aspirin EC 81 MG tablet Take 1 tablet (81 mg total) by mouth daily. Swallow whole.   Budeson-Glycopyrrol-Formoterol (BREZTRI AEROSPHERE) 160-9-4.8 MCG/ACT AERO Inhale 2 puffs into the lungs in the morning and at bedtime.   clotrimazole-betamethasone (LOTRISONE) cream Apply 1 Application topically daily.   fluticasone (FLONASE) 50 MCG/ACT nasal spray Place 2 sprays into both nostrils daily.   fluticasone-salmeterol (WIXELA INHUB) 250-50 MCG/ACT AEPB Inhale 1 puff into the lungs in the morning and at bedtime.   ipratropium-albuterol (DUONEB) 0.5-2.5 (3) MG/3ML SOLN Take 3 mLs by nebulization every 6 (six) hours as needed.   No facility-administered encounter medications on file as of 06/20/2023.     Immunization History  Administered Date(s) Administered   Influenza, High Dose Seasonal PF 05/21/2023   PFIZER(Purple Top)SARS-COV-2 Vaccination 10/26/2019, 11/24/2019, 07/16/2020   PNEUMOCOCCAL CONJUGATE-20 05/26/2023     PFTs    Latest Ref Rng & Units 05/25/2023    8:35 AM  PFT Results  FVC-Pre L 2.66   FVC-Predicted Pre % 63   FVC-Post L 2.75   FVC-Predicted Post % 65   Pre FEV1/FVC % % 55   Post FEV1/FCV % % 54   FEV1-Pre L 1.48   FEV1-Predicted Pre % 48   FEV1-Post L 1.47   DLCO uncorrected ml/min/mmHg 21.82   DLCO UNC% % 88   DLCO corrected ml/min/mmHg 21.82   DLCO COR %Predicted  % 88   DLVA Predicted % 115   TLC L 6.38   TLC % Predicted % 94   RV % Predicted % 165     Eosinophils Most recent blood eosinophil count was 600 cells/microL taken on 05/25/2023.   IgE: 1068 on 05/25/2023  Assessment   Biologics training for dupilumab (Dupixent)  Goals of therapy: Mechanism: human monoclonal IgG4 antibody that inhibits interleukin-4 and interleukin-13 cytokine-induced responses, including release of proinflammatory cytokines, chemokines, and IgE Reviewed that Dupixent is add-on medication and patient must continue maintenance inhaler regimen. Response to therapy: may take 4 months to determine efficacy. Discussed that patients generally feel improvement sooner than 4 months.  Side effects: injection site reaction (6-18%), antibody development (5-16%), ophthalmic conjunctivitis (2-16%), transient blood eosinophilia (1-2%)  Dose: 300mg  SQ every 14 days (for COPD dx)  Administration/Storage: to be reviewed in detail at new start visit Reviewed administration sites of thigh or abdomen (at least 2-3 inches away from abdomen). Reviewed the upper arm is only appropriate if caregiver is administering injection  Reviewed storage of medication in refrigerator. Reviewed that Dupixent can be stored at room temperature in unopened carton for up to 14 days.  Medication Reconciliation  A drug regimen assessment was performed, including review of allergies, interactions, disease-state management, dosing and immunization history. Medications were reviewed with the patient, including name, instructions, indication, goals of therapy, potential side effects, importance of adherence, and safe use.  Drug interaction(s): none noted  PLAN Deferring initiation of Dupixent. Since starting [employer], patient  has noticed significant improvement. Would like to wait to confirm sustained response before escalating treatment. Continue maintenance regimen of: Breztri 160-9-4.14mcg (2  puffs twice daily) Breztri pt assistance application completed today  All questions encouraged and answered.  Instructed patient to reach out with any further questions or concerns.  Thank you for allowing pharmacy to participate in this patient's care.  This appointment required 20 minutes of patient care (this includes precharting, chart review, review of results, face-to-face care, etc.).   Chesley Mires, PharmD, MPH, BCPS, CPP Clinical Pharmacist (Rheumatology and Pulmonology)

## 2023-06-27 ENCOUNTER — Encounter: Payer: Self-pay | Admitting: *Deleted

## 2023-07-11 ENCOUNTER — Telehealth: Payer: Self-pay

## 2023-07-11 ENCOUNTER — Telehealth: Payer: Self-pay | Admitting: Cardiology

## 2023-07-11 ENCOUNTER — Telehealth: Payer: Self-pay | Admitting: *Deleted

## 2023-07-11 ENCOUNTER — Other Ambulatory Visit: Payer: Self-pay | Admitting: *Deleted

## 2023-07-11 DIAGNOSIS — R195 Other fecal abnormalities: Secondary | ICD-10-CM

## 2023-07-11 DIAGNOSIS — Z1211 Encounter for screening for malignant neoplasm of colon: Secondary | ICD-10-CM

## 2023-07-11 MED ORDER — NA SULFATE-K SULFATE-MG SULF 17.5-3.13-1.6 GM/177ML PO SOLN: 1.0000 | Freq: Once | ORAL | 0 refills | Status: AC

## 2023-07-11 NOTE — Telephone Encounter (Signed)
Colonoscopy schedule with Dr Tobi Bastos on 08/22/2023

## 2023-07-11 NOTE — Telephone Encounter (Signed)
Gastroenterology Pre-Procedure Review  Request Date: 08/22/2023 Requesting Physician: Dr. Tobi Bastos  PATIENT REVIEW QUESTIONS: The patient responded to the following health history questions as indicated:    1. Are you having any GI issues? no 2. Do you have a personal history of Polyps? no 3. Do you have a family history of Colon Cancer or Polyps? no 4. Diabetes Mellitus? no 5. Joint replacements in the past 12 months?no 6. Major health problems in the past 3 months?no 7. Any artificial heart valves, MVP, or defibrillator?yes (paroxysmal atrial fibrillation)    MEDICATIONS & ALLERGIES:    Patient reports the following regarding taking any anticoagulation/antiplatelet therapy:   Plavix, Coumadin, Eliquis, Xarelto, Lovenox, Pradaxa, Brilinta, or Effient? no Aspirin? yes (81 mg)  Patient confirms/reports the following medications:  Current Outpatient Medications  Medication Sig Dispense Refill   Acetaminophen (TYLENOL PO) Take 2 tablets by mouth 2 (two) times daily as needed (pain, fever, headache).     aspirin EC 81 MG tablet Take 1 tablet (81 mg total) by mouth daily. Swallow whole.     Budeson-Glycopyrrol-Formoterol (BREZTRI AEROSPHERE) 160-9-4.8 MCG/ACT AERO Inhale 2 puffs into the lungs in the morning and at bedtime. 10.7 g 5   clotrimazole-betamethasone (LOTRISONE) cream Apply 1 Application topically daily. 15 g 0   fluticasone (FLONASE) 50 MCG/ACT nasal spray Place 2 sprays into both nostrils daily. 16 g 0   ipratropium-albuterol (DUONEB) 0.5-2.5 (3) MG/3ML SOLN Take 3 mLs by nebulization every 6 (six) hours as needed.     No current facility-administered medications for this visit.    Patient confirms/reports the following allergies:  No Known Allergies  No orders of the defined types were placed in this encounter.   AUTHORIZATION INFORMATION Primary Insurance: 1D#: Group #:  Secondary Insurance: 1D#: Group #:  SCHEDULE INFORMATION: Date: 08/22/2023 Time: Location:   [facility]

## 2023-07-11 NOTE — Telephone Encounter (Signed)
   Name: Ronnie Palmer  DOB: 07/08/1951  MRN: [id number]  Primary Cardiologist: None   Preoperative team, please contact this patient and set up a phone call appointment for further preoperative risk assessment. Please obtain consent and complete medication review. Thank you for your help.  I confirm that guidance regarding antiplatelet and oral anticoagulation therapy has been completed and, if necessary, noted below.  Per office protocol, if patient is without any new symptoms or concerns at the time of their virtual visit, he/she may hold ASA for 7 days prior to procedure. Please resume ASA as soon as possible postprocedure, at the discretion of the surgeon.    I also confirmed the patient resides in the state of [place]. As per [facility] Medical Board telemedicine laws, the patient must reside in the state in which the provider is licensed.   Joni Reining, NP 07/11/2023, 5:08 PM [facility]

## 2023-07-11 NOTE — Telephone Encounter (Signed)
Pt has an appt 07/21/23 with Dr. Azucena Cecil and procedure is scheduled for 08/22/23. Will confirm with pre op APP ok to defer clearance to MD 07/21/23 appt.

## 2023-07-11 NOTE — Telephone Encounter (Signed)
Patient daughter Misty Stanley is calling which is on patient DPR form to schedule patient colonoscopy. She said to call her back to get this schedule

## 2023-07-11 NOTE — Telephone Encounter (Signed)
   Pre-operative Risk Assessment    Patient Name: Ronnie Palmer  DOB: [birth year] MRN: [id number]      Request for Surgical Clearance    Procedure:   colonoscopy  Date of Surgery:  Clearance 08/22/23                                 Surgeon:  not indicated Surgeon's Group or Practice Name:  [facility] Phone number:  [phone] Fax number:  [phone]   Type of Clearance Requested:   - Pharmacy:  Hold Aspirin     Type of Anesthesia:  General    Additional requests/questions:    Queen Slough   07/11/2023, 4:15 PM

## 2023-07-12 NOTE — Telephone Encounter (Signed)
Okay to defer to MD appt. Please add PRE-OP to appt notes  KL

## 2023-07-21 ENCOUNTER — Ambulatory Visit: Payer: No Typology Code available for payment source | Attending: Cardiology | Admitting: Cardiology

## 2023-07-21 ENCOUNTER — Encounter: Payer: Self-pay | Admitting: Cardiology

## 2023-07-21 VITALS — BP 122/68 | HR 56 | Ht 69.0 in | Wt 168.2 lb

## 2023-07-21 DIAGNOSIS — Z01810 Encounter for preprocedural cardiovascular examination: Secondary | ICD-10-CM

## 2023-07-21 DIAGNOSIS — I480 Paroxysmal atrial fibrillation: Secondary | ICD-10-CM | POA: Diagnosis not present

## 2023-07-21 DIAGNOSIS — I2510 Atherosclerotic heart disease of native coronary artery without angina pectoris: Secondary | ICD-10-CM | POA: Diagnosis not present

## 2023-07-21 NOTE — Patient Instructions (Signed)
Medication Instructions:   Your physician recommends that you continue on your current medications as directed. Please refer to the Current Medication list given to you today.  *If you need a refill on your cardiac medications before your next appointment, please call your pharmacy*   Lab Work:  None Ordered  If you have labs (blood work) drawn today and your tests are completely normal, you will receive your results only by: MyChart Message (if you have MyChart) OR A paper copy in the mail If you have any lab test that is abnormal or we need to change your treatment, we will call you to review the results.   Testing/Procedures:  None ordered   Follow-Up: At [facility], you and your health needs are our priority.  As part of our continuing mission to provide you with exceptional heart care, we have created designated Provider Care Teams.  These Care Teams include your primary Cardiologist (physician) and Advanced Practice Providers (APPs -  Physician Assistants and Nurse Practitioners) who all work together to provide you with the care you need, when you need it.  We recommend signing up for the patient portal called "MyChart".  Sign up information is provided on this After Visit Summary.  MyChart is used to connect with patients for Virtual Visits (Telemedicine).  Patients are able to view lab/test results, encounter notes, upcoming appointments, etc.  Non-urgent messages can be sent to your provider as well.   To learn more about what you can do with MyChart, go to [email or web address].    Your next appointment:   12 months   Provider:   You may see Debbe Odea, MD or one of the following Advanced Practice Providers on your designated Care Team:   Nicolasa Ducking, NP Eula Listen, PA-C Cadence Fransico Michael, PA-C Charlsie Quest, NP Carlos Levering, NP

## 2023-07-21 NOTE — Progress Notes (Signed)
Cardiology Office Note:    Date:  07/21/2023   ID:  Ronnie Palmer, DOB Jun 10, 1951, MRN [id number]  PCP:  [name], NP   [facility] Providers Cardiologist:  Debbe Odea, MD     Referring MD: [name], NP   Chief Complaint  Patient presents with   Follow-up    Discuss cardiac testing.  Patient denies new or acute cardiac problems/concerns today.       History of Present Illness:    Ronnie Palmer is a 72 y.o. male with a hx of paroxysmal A-fib, former smoker x 40+ years presenting for follow-up.    Previously seen due to paroxysmal atrial fibrillation occurring in the context of respiratory distress.  This was a one-time occurrence, has not had any episodes.  Echo was obtained, cardiac monitor was placed to evaluate any significant arrhythmias, not on anticoagulation due to CHA2DS2-VASc of 1.  Presents for cardiac testing results.  Feels well, no new concerns.   Past Medical History:  Diagnosis Date   Paroxysmal atrial fibrillation [facility])     Past Surgical History:  Procedure Laterality Date   NO PAST SURGERIES      Current Medications: Current Meds  Medication Sig   Acetaminophen (TYLENOL PO) Take 2 tablets by mouth 2 (two) times daily as needed (pain, fever, headache).   aspirin EC 81 MG tablet Take 1 tablet (81 mg total) by mouth daily. Swallow whole.   Budeson-Glycopyrrol-Formoterol (BREZTRI AEROSPHERE) 160-9-4.8 MCG/ACT AERO Inhale 2 puffs into the lungs in the morning and at bedtime.   clotrimazole-betamethasone (LOTRISONE) cream Apply 1 Application topically daily.   fluticasone (FLONASE) 50 MCG/ACT nasal spray Place 2 sprays into both nostrils daily.   ipratropium-albuterol (DUONEB) 0.5-2.5 (3) MG/3ML SOLN Take 3 mLs by nebulization every 6 (six) hours as needed.     Allergies:   Patient has no known allergies.   Social History   Socioeconomic History   Marital status: Married    Spouse name: Thao   Number of  children: 4   Years of education: Not on file   Highest education level: Not on file  Occupational History   Not on file  Tobacco Use   Smoking status: Former    Current packs/day: 0.00    Average packs/day: 0.3 packs/day for 40.0 years (10.0 ttl pk-yrs)    Types: Cigarettes    Start date: 12/17/1982    Quit date: 12/17/2022    Years since quitting: 0.5   Smokeless tobacco: Never   Tobacco comments:    Has not smoked since sick  Vaping Use   Vaping status: Never Used  Substance and Sexual Activity   Alcohol use: Not Currently    Comment: social drinker   Drug use: Never   Sexual activity: Yes  Other Topics Concern   Not on file  Social History Narrative   Retired    International aid/development worker of [employer] Strain: Not on file  Food Insecurity: Not on file  Transportation Needs: Not on file  Physical Activity: Not on file  Stress: Not on file  Social Connections: Not on file     Family History: The patient's family history is not on file.  ROS:   Please see the history of present illness.     All other systems reviewed and are negative.  EKGs/Labs/Other Studies Reviewed:    The following studies were reviewed today:  EKG Interpretation Date/Time:  Thursday July 21 2023 08:24:25 EST Ventricular Rate:  56 PR Interval:  202 QRS Duration:  114 QT Interval:  420 QTC Calculation: 405 R Axis:   63  Text Interpretation: Sinus bradycardia Confirmed by Debbe Odea ([id number]) on 07/21/2023 8:30:11 AM    Recent Labs: 02/03/2023: B Natriuretic Peptide 76.2 05/26/2023: ALT 23; BUN 18; Creatinine, Ser 0.75; Hemoglobin 12.6; Platelets 252.0; Potassium 3.6; Sodium 139; TSH 0.36  Recent Lipid Panel    Component Value Date/Time   CHOL 174 05/26/2023 0835   TRIG 170.0 (H) 05/26/2023 0835   HDL 49.90 05/26/2023 0835   CHOLHDL 3 05/26/2023 0835   VLDL 34.0 05/26/2023 0835   LDLCALC 90 05/26/2023 0835     Risk Assessment/Calculations:                Physical Exam:    VS:  BP 122/68 (BP Location: Left Arm, Patient Position: Sitting, Cuff Size: Normal)   Pulse (!) 56   Ht 5\' 9"  (1.753 m)   Wt 168 lb 3.2 oz (76.3 kg)   SpO2 95%   BMI 24.84 kg/m     Wt Readings from Last 3 Encounters:  07/21/23 168 lb 3.2 oz (76.3 kg)  05/26/23 164 lb 9.6 oz (74.7 kg)  05/25/23 163 lb 12.8 oz (74.3 kg)     GEN:  Well nourished, well developed in no acute distress HEENT: Normal NECK: No JVD; No carotid bruits CARDIAC: RRR, no murmurs, rubs, gallops RESPIRATORY: Bilateral wheezing noted ABDOMEN: Soft, non-tender, non-distended MUSCULOSKELETAL:  No edema; No deformity  SKIN: Warm and dry NEUROLOGIC:  Alert and oriented x 3 PSYCHIATRIC:  Normal affect   ASSESSMENT:    1. Paroxysmal atrial fibrillation (HCC)   2. Coronary artery calcification   3. Pre-procedural cardiovascular examination    PLAN:    In order of problems listed above:  Paroxysmal A-fib, in setting of respiratory distress.  Echo 9/24 EF 60 to 65%.  Cardiac monitor showed occasional paroxysmal SVT, no A-fib or flutter recurrence.  CHA2DS2-VASc score 1.?  Lone A-fib, baseline bradycardia.  Okay for aspirin 81 mg daily. LAD calcifications on chest CT, aspirin 81 as above, LDL at goal. Preprocedural exam, colonoscopy being planned.  Okay for colonoscopy, okay to hold aspirin.  Restart aspirin after procedure.  Follow-up in 12 months       Medication Adjustments/Labs and Tests Ordered: Current medicines are reviewed at length with the patient today.  Concerns regarding medicines are outlined above.  Orders Placed This Encounter  Procedures   EKG 12-Lead   No orders of the defined types were placed in this encounter.   Patient Instructions  Medication Instructions:   Your physician recommends that you continue on your current medications as directed. Please refer to the Current Medication list given to you today.  *If you need a refill on your cardiac medications  before your next appointment, please call your pharmacy*   Lab Work:  None Ordered  If you have labs (blood work) drawn today and your tests are completely normal, you will receive your results only by: MyChart Message (if you have MyChart) OR A paper copy in the mail If you have any lab test that is abnormal or we need to change your treatment, we will call you to review the results.   Testing/Procedures:  None ordered   Follow-Up: At [facility], you and your health needs are our priority.  As part of our continuing mission to provide you with exceptional heart care, we have created designated Provider Care Teams.  These Care  Teams include your primary Cardiologist (physician) and Advanced Practice Providers (APPs -  Physician Assistants and Nurse Practitioners) who all work together to provide you with the care you need, when you need it.  We recommend signing up for the patient portal called "MyChart".  Sign up information is provided on this After Visit Summary.  MyChart is used to connect with patients for Virtual Visits (Telemedicine).  Patients are able to view lab/test results, encounter notes, upcoming appointments, etc.  Non-urgent messages can be sent to your provider as well.   To learn more about what you can do with MyChart, go to [email or web address].    Your next appointment:   12 months   Provider:   You may see Debbe Odea, MD or one of the following Advanced Practice Providers on your designated Care Team:   Nicolasa Ducking, NP Eula Listen, PA-C Cadence Fransico Michael, PA-C Charlsie Quest, NP Carlos Levering, NP   Signed, Debbe Odea, MD  07/21/2023 9:47 AM    [facility]

## 2023-08-02 NOTE — Progress Notes (Unsigned)
  Electrophysiology Office Note:    Date:  08/03/2023   ID:  Ronnie Palmer, DOB [birth year], MRN [id number]  [facility] Cardiologist:  Debbe Odea, MD  [facility] Electrophysiologist:  Lanier Prude, MD   Referring MD: Debbe Odea, MD   Chief Complaint: Atrial fibrillation  History of Present Illness:    The patient is a 72 year old man Seen today for an evaluation of atrial fibrillation at the request of Dr. Azucena Cecil.  The patient initially was diagnosed with atrial fibrillation in the setting of a respiratory infection.  Given a CHA2DS2-VASc of 1 he has been maintained on aspirin 81 mg by mouth once daily.  After the initial diagnosis during respiratory infection he has not had a recurrence of arrhythmia.  He wore a heart monitor which showed no atrial fibrillation episodes.  His daughter provided interpretation during today's visit.  No in person interpreter was available.    Their past medical, social and family history was reveiwed.   ROS:   Please see the history of present illness.    All other systems reviewed and are negative.  EKGs/Labs/Other Studies Reviewed:    The following studies were reviewed today:  June 07, 2023 ZIO monitor personally reviewed Heart rate 30-1 33, average 74 Note atrial fibrillation Wenckebach was present Rare supraventricular and ventricular ectopy  May 24, 2023 echo EF 60 to 65% RV function normal No significant valvular disease  February 03, 2023 EKG shows atrial fibrillation with rapid ventricular rate  May 04, 2023 EKG shows sinus rhythm with a first-degree AV delay.  No preexcitation.        Physical Exam:    VS:  BP 110/62   Pulse 67   Ht 5\' 9"  (1.753 m)   Wt 166 lb (75.3 kg)   SpO2 95%   BMI 24.51 kg/m     Wt Readings from Last 3 Encounters:  08/03/23 166 lb (75.3 kg)  07/21/23 168 lb 3.2 oz (76.3 kg)  05/26/23 164 lb 9.6 oz (74.7 kg)    General: No acute  distress Cardiac: Regular rate and rhythm no murmurs rubs or gallops Respiratory: No increased work of breathing.  Clear to auscultation bilaterally      ASSESSMENT AND PLAN:    1. Paroxysmal atrial fibrillation (HCC)        #Paroxysmal atrial fibrillation Appears to be a lone episode of atrial fibrillation in the setting of a respiratory illness.  No recurrence on follow-up heart monitor.  He has normal ejection fraction.  On aspirin 81 mg by mouth given a CHA2DS2-VASc of 1.  For now, I would recommend continuing to monitor for recurrence of arrhythmia.  We discussed the possibility that it would recur.   We discussed using a wearable heart monitor or the [employer] device.  I would recommend monitoring his heart rhythm a few times per week.  He will follow-up with EP APP in 6 months.        Signed, Rossie Muskrat. Lalla Brothers, MD, [facility], [facility] 08/03/2023 8:08 AM    Electrophysiology [facility]

## 2023-08-03 ENCOUNTER — Encounter: Payer: Self-pay | Admitting: Cardiology

## 2023-08-03 ENCOUNTER — Ambulatory Visit: Payer: No Typology Code available for payment source | Attending: Cardiology | Admitting: Cardiology

## 2023-08-03 VITALS — BP 110/62 | HR 67 | Ht 69.0 in | Wt 166.0 lb

## 2023-08-03 DIAGNOSIS — I480 Paroxysmal atrial fibrillation: Secondary | ICD-10-CM | POA: Diagnosis not present

## 2023-08-03 NOTE — Patient Instructions (Signed)
Medication Instructions:  Your physician recommends that you continue on your current medications as directed. Please refer to the Current Medication list given to you today.  *If you need a refill on your cardiac medications before your next appointment, please call your pharmacy*  Follow-Up: At [facility], you and your health needs are our priority.  As part of our continuing mission to provide you with exceptional heart care, we have created designated Provider Care Teams.  These Care Teams include your primary Cardiologist (physician) and Advanced Practice Providers (APPs -  Physician Assistants and Nurse Practitioners) who all work together to provide you with the care you need, when you need it.  Your next appointment:   6 month  Provider:   Sherie Don, NP    Lourena Simmonds for monitoring of EKGs at home: You can look into the [facility] device by [employer].This device is purchased by you and it connects to an application you download to your smart phone.  It can detect abnormal heart rhythms and alert you to contact your doctor for further evaluation. The device is approximately $90 and the phone application is free.  The web site is:  [email or web address]://www.alivecor.com

## 2023-08-08 ENCOUNTER — Ambulatory Visit (INDEPENDENT_AMBULATORY_CARE_PROVIDER_SITE_OTHER): Payer: No Typology Code available for payment source | Admitting: Pulmonary Disease

## 2023-08-08 ENCOUNTER — Encounter: Payer: Self-pay | Admitting: Pulmonary Disease

## 2023-08-08 DIAGNOSIS — J4489 Other specified chronic obstructive pulmonary disease: Secondary | ICD-10-CM

## 2023-08-08 MED ORDER — BREZTRI AEROSPHERE 160-9-4.8 MCG/ACT IN AERO: 2.0000 | INHALATION_SPRAY | Freq: Two times a day (BID) | RESPIRATORY_TRACT | 11 refills | Status: DC

## 2023-08-08 MED ORDER — IPRATROPIUM-ALBUTEROL 0.5-2.5 (3) MG/3ML IN SOLN: 3.0000 mL | Freq: Four times a day (QID) | RESPIRATORY_TRACT | 11 refills | Status: AC | PRN

## 2023-08-08 NOTE — Progress Notes (Unsigned)
Synopsis: Referred in June 2024 for hospital follow up for acute hypoxemic respiratory failure  Subjective:   PATIENT ID: Ronnie Palmer GENDER: male DOB: Jun 22, 1951, MRN: [id number]  HPI  Chief Complaint  Patient presents with   Follow-up   Alcindor Eliassen is a 72 year old male, recent former smoker who returns to pulmonary clinic for COPD.  Initial OV 02/09/23 He was admitted 6/6 to 6/8 for acute hypoxemic respiratory failure due to possible asthma/COPD exacerbation and paroxysmal atrial fibrillation. He was treated with prednisone and Levaquin. Started on Cardizem and daily aspirin. Prior to this admission he was seen in the ER on 4/7 for cough.   No issues with GERD symptoms. No seasonal allergies. No sinus congestion or post nasal drainage. He has night time awakenings due to cough. No fevers, chills or sweats. His appetite is down. His weight is down a bit. No prior episodes like this in the past with his cough and breathing. He was active prior to developing these symptoms, he would walk for 1 hour in the morning and in the evenings.   Absolute eosinophil count was 300 on 02/03/23  He has been smoking 0.5ppd for 50+ years. He has quit smoking for now. He worked as a Patent examiner. No other dust or chemical exposures. He is accompanied by his daughter.  OV 05/25/23 Patient has quit smoking since last visit. The prolonged steroid taper initially helped with his symptoms but once complete continued to have issues with post-nasal drip, cough and dyspnea. Advair inhaler does not seem to help much. The patient's symptoms are particularly bothersome at night, disrupting sleep. The patient denies any heartburn or reflux symptoms.  Today OV 08/08/23 He has been using breztri inhaler and fluticasone nasal spray, which have led to significant improvements in his cough and breathing symptoms.Marland Kitchen He reports better breathing and reduced coughing. He is sleeping through the night without  disruption from coughing. He also uses a nebulizer machine twice a day, which seems to be helping. He has a small amount of mucus build-up in the morning. He was scheduled to start dupixent but wanted to hold off after his symptoms improved with inhaler and nasal spray.  Past Medical History:  Diagnosis Date   Paroxysmal atrial fibrillation [facility])      History reviewed. No pertinent family history.   Social History   Socioeconomic History   Marital status: Married    Spouse name: Thao   Number of children: 4   Years of education: Not on file   Highest education level: Not on file  Occupational History   Not on file  Tobacco Use   Smoking status: Former    Current packs/day: 0.00    Average packs/day: 0.3 packs/day for 40.0 years (10.0 ttl pk-yrs)    Types: Cigarettes    Start date: 12/17/1982    Quit date: 12/17/2022    Years since quitting: 0.6   Smokeless tobacco: Never   Tobacco comments:    Has not smoked since sick  Vaping Use   Vaping status: Never Used  Substance and Sexual Activity   Alcohol use: Not Currently    Comment: social drinker   Drug use: Never   Sexual activity: Yes  Other Topics Concern   Not on file  Social History Narrative   Retired    International aid/development worker of [employer] Strain: Not on file  Food Insecurity: Not on file  Transportation Needs: Not on file  Physical Activity: Not on  file  Stress: Not on file  Social Connections: Not on file  Intimate Partner Violence: Not on file     No Known Allergies   Outpatient Medications Prior to Visit  Medication Sig Dispense Refill   Acetaminophen (TYLENOL PO) Take 2 tablets by mouth 2 (two) times daily as needed (pain, fever, headache).     aspirin EC 81 MG tablet Take 1 tablet (81 mg total) by mouth daily. Swallow whole.     clotrimazole-betamethasone (LOTRISONE) cream Apply 1 Application topically daily. 15 g 0   fluticasone (FLONASE) 50 MCG/ACT nasal spray Place 2 sprays into  both nostrils daily. 16 g 0   Budeson-Glycopyrrol-Formoterol (BREZTRI AEROSPHERE) 160-9-4.8 MCG/ACT AERO Inhale 2 puffs into the lungs in the morning and at bedtime. 10.7 g 5   ipratropium-albuterol (DUONEB) 0.5-2.5 (3) MG/3ML SOLN Take 3 mLs by nebulization every 6 (six) hours as needed.     No facility-administered medications prior to visit.   Review of Systems  Constitutional:  Negative for chills, fever, malaise/fatigue and weight loss.  HENT:  Negative for congestion, sinus pain and sore throat.   Eyes: Negative.   Respiratory:  Negative for cough, hemoptysis, sputum production, shortness of breath and wheezing.   Cardiovascular:  Negative for chest pain, palpitations, orthopnea, claudication and leg swelling.  Gastrointestinal:  Negative for abdominal pain, heartburn, nausea and vomiting.  Genitourinary: Negative.   Musculoskeletal:  Negative for joint pain and myalgias.  Skin:  Negative for rash.  Neurological:  Negative for weakness.  Endo/Heme/Allergies: Negative.   Psychiatric/Behavioral: Negative.     Objective:   Vitals:   08/08/23 1051 08/08/23 1052  BP: 131/77   Pulse: (!) 52   Temp: (!) 97.5 F (36.4 C)   TempSrc: Temporal   SpO2: 96% 96%  Weight: 172 lb 14.4 oz (78.4 kg)   Height: 5\' 9"  (1.753 m)     Physical Exam Constitutional:      General: He is not in acute distress. HENT:     Head: Normocephalic and atraumatic.  Eyes:     Conjunctiva/sclera: Conjunctivae normal.  Cardiovascular:     Rate and Rhythm: Normal rate and regular rhythm.     Pulses: Normal pulses.     Heart sounds: Normal heart sounds. No murmur heard. Pulmonary:     Breath sounds: No wheezing, rhonchi or rales.  Musculoskeletal:     Right lower leg: No edema.     Left lower leg: No edema.  Skin:    General: Skin is warm and dry.  Neurological:     General: No focal deficit present.     Mental Status: He is alert.    CBC    Component Value Date/Time   WBC 5.3 05/26/2023 0835    RBC 4.81 05/26/2023 0835   HGB 12.6 (L) 05/26/2023 0835   HCT 40.2 05/26/2023 0835   PLT 252.0 05/26/2023 0835   MCV 83.5 05/26/2023 0835   MCH 26.8 02/04/2023 0204   MCHC 31.4 05/26/2023 0835   RDW 13.5 05/26/2023 0835   LYMPHSABS 1.6 05/25/2023 1126   MONOABS 0.7 05/25/2023 1126   EOSABS 0.6 05/25/2023 1126   BASOSABS 0.1 05/25/2023 1126      Latest Ref Rng & Units 05/26/2023    8:35 AM 02/23/2023   11:37 AM 02/04/2023    2:04 AM  BMP  Glucose 70 - 99 mg/dL 99  88  960   BUN 6 - 23 mg/dL 18  26  27    Creatinine 0.40 -  1.50 mg/dL 9.56  2.13  0.86   Sodium 135 - 145 mEq/L 139  140  139   Potassium 3.5 - 5.1 mEq/L 3.6  3.4  3.8   Chloride 96 - 112 mEq/L 103  103  103   CO2 19 - 32 mEq/L 29  28  24    Calcium 8.4 - 10.5 mg/dL 9.3  9.5  9.0    Chest imaging: CTA Chest 02/03/23 Cardiovascular: Satisfactory opacification of the pulmonary arteries to the segmental level. No evidence of pulmonary embolism. Normal heart size. No pericardial effusion. There are atherosclerotic calcifications of the aorta.   Mediastinum/Nodes: No enlarged mediastinal, hilar, or axillary lymph nodes. Thyroid gland, trachea, and esophagus demonstrate no significant findings.   Lungs/Pleura: Lungs are clear. No pleural effusion or pneumothorax.  PFT:    Latest Ref Rng & Units 05/25/2023    8:35 AM  PFT Results  FVC-Pre L 2.66   FVC-Predicted Pre % 63   FVC-Post L 2.75   FVC-Predicted Post % 65   Pre FEV1/FVC % % 55   Post FEV1/FCV % % 54   FEV1-Pre L 1.48   FEV1-Predicted Pre % 48   FEV1-Post L 1.47   DLCO uncorrected ml/min/mmHg 21.82   DLCO UNC% % 88   DLCO corrected ml/min/mmHg 21.82   DLCO COR %Predicted % 88   DLVA Predicted % 115   TLC L 6.38   TLC % Predicted % 94   RV % Predicted % 165    Labs:  Path:  Echo:  Heart Catheterization:    Assessment & Plan:   Asthma-COPD overlap syndrome (HCC) - Plan: Budeson-Glycopyrrol-Formoterol (BREZTRI AEROSPHERE) 160-9-4.8 MCG/ACT  AERO, ipratropium-albuterol (DUONEB) 0.5-2.5 (3) MG/3ML SOLN  Discussion: Ronnie Palmer is a 72 year old male, recent former smoker who is referred to pulmonary clinic for COPD.  Asthma-COPD Overlap Syndrome Improved symptoms with Breztri inhaler (2 puffs BID) and nebulizer (2 times daily). No current wheezing or shortness of breath. Minimal morning mucus production. High eosinophil count and IgE level indicating allergic inflammation. -Continue Breztri inhaler and nebulizer as prescribed. -Monitor for seasonal symptom changes and consider adding over-the-counter or prescription allergy medication if needed.   Post-nasal Drip Cough worsens with change in position, suggestive of post-nasal drip. -Resume Flonase nasal spray, one spray per nostril daily for at least one month. -Add Ipratropium nasal spray, two sprays per nostril twice daily. Monitor for potential nose bleeding.  Check in 6 months or sooner if symptoms worsen.  Melody Comas, MD [facility] Office: [phone]   Current Outpatient Medications:    Acetaminophen (TYLENOL PO), Take 2 tablets by mouth 2 (two) times daily as needed (pain, fever, headache)., Disp: , Rfl:    aspirin EC 81 MG tablet, Take 1 tablet (81 mg total) by mouth daily. Swallow whole., Disp: , Rfl:    clotrimazole-betamethasone (LOTRISONE) cream, Apply 1 Application topically daily., Disp: 15 g, Rfl: 0   fluticasone (FLONASE) 50 MCG/ACT nasal spray, Place 2 sprays into both nostrils daily., Disp: 16 g, Rfl: 0   Budeson-Glycopyrrol-Formoterol (BREZTRI AEROSPHERE) 160-9-4.8 MCG/ACT AERO, Inhale 2 puffs into the lungs in the morning and at bedtime., Disp: 10.7 g, Rfl: 11   ipratropium-albuterol (DUONEB) 0.5-2.5 (3) MG/3ML SOLN, Take 3 mLs by nebulization every 6 (six) hours as needed., Disp: 360 mL, Rfl: 11

## 2023-08-08 NOTE — Patient Instructions (Signed)
Continue breztri inhaler 2 puffs twice daily - rinse mouth out after each use  Use duoneb nebulizer treatment twice daily  Follow up in 6 months, call sooner if symptoms worsen

## 2023-08-09 ENCOUNTER — Encounter: Payer: Self-pay | Admitting: Pulmonary Disease

## 2023-08-11 ENCOUNTER — Telehealth: Payer: Self-pay | Admitting: Cardiology

## 2023-08-11 NOTE — Telephone Encounter (Signed)
   Pre-operative Risk Assessment    Patient Name: Ronnie Palmer  DOB: [birth year] MRN: [id number]      Request for Surgical Clearance    Procedure:   colonoscopy  Date of Surgery:  Clearance 08/22/23                                 Surgeon:  not indicated Surgeon's Group or Practice Name:  [facility] Phone number:  [phone] Fax number:  [phone]   Type of Clearance Requested:   - Pharmacy:  Hold Aspirin     Type of Anesthesia:  General    Additional requests/questions:    Queen Slough   08/11/2023, 2:25 PM

## 2023-08-11 NOTE — Telephone Encounter (Signed)
   Patient Name: Ronnie Palmer  DOB: 19-May-1951 MRN: [id number]  Primary Cardiologist: Debbe Odea, MD  Chart reviewed as part of pre-operative protocol coverage. Given past medical history and time since last visit, based on ACC/AHA guidelines, Bueford Dorfman is at acceptable risk for the planned procedure without further cardiovascular testing.   The patient was advised that if he develops new symptoms prior to surgery to contact our office to arrange for a follow-up visit, and he verbalized understanding.  Per office protocol, if patient is without any new symptoms or concerns at the time of their virtual visit, he/she may hold ASA for 7 days days prior to procedure. Please resume ASA as soon as possible postprocedure, at the discretion of the surgeon.    I will route this recommendation to the requesting party via Epic fax function and remove from pre-op pool.  Please call with questions.  Joni Reining, NP 08/11/2023, 3:26 PM

## 2023-08-16 ENCOUNTER — Telehealth: Payer: Self-pay | Admitting: *Deleted

## 2023-08-16 NOTE — Telephone Encounter (Signed)
Patient's daughter Misty Stanley) on DPR was notified of the instructions below

## 2023-08-16 NOTE — Telephone Encounter (Signed)
Per cardiology on 08/11/2023  Per office protocol, if patient is without any new symptoms or concerns at the time of their virtual visit, he/she may hold ASA for 7 days days prior to procedure. Please resume ASA as soon as possible postprocedure, at the discretion of the surgeon.     I will route this recommendation to the requesting party via Epic fax function and remove from pre-op pool.   Please call with questions.   Joni Reining, NP 08/11/2023, 3:26 PM

## 2023-08-19 ENCOUNTER — Encounter: Payer: Self-pay | Admitting: Gastroenterology

## 2023-08-22 ENCOUNTER — Encounter: Payer: Self-pay | Admitting: Gastroenterology

## 2023-08-22 ENCOUNTER — Telehealth: Payer: Self-pay | Admitting: *Deleted

## 2023-08-22 ENCOUNTER — Encounter: Payer: Self-pay | Admitting: Anesthesiology

## 2023-08-22 ENCOUNTER — Encounter: Admission: RE | Payer: Self-pay | Source: Home / Self Care

## 2023-08-22 ENCOUNTER — Ambulatory Visit
Admission: RE | Admit: 2023-08-22 | Payer: No Typology Code available for payment source | Source: Home / Self Care | Admitting: Gastroenterology

## 2023-08-22 ENCOUNTER — Other Ambulatory Visit: Payer: Self-pay | Admitting: *Deleted

## 2023-08-22 DIAGNOSIS — R195 Other fecal abnormalities: Secondary | ICD-10-CM

## 2023-08-22 DIAGNOSIS — Z1211 Encounter for screening for malignant neoplasm of colon: Secondary | ICD-10-CM

## 2023-08-22 SURGERY — COLONOSCOPY WITH PROPOFOL
Anesthesia: General

## 2023-08-22 MED ORDER — NA SULFATE-K SULFATE-MG SULF 17.5-3.13-1.6 GM/177ML PO SOLN: 1.0000 | Freq: Once | ORAL | 0 refills | Status: AC

## 2023-08-22 NOTE — Telephone Encounter (Signed)
Patient's daughter Misty Stanley called back)  Requesting to reschedule to next available on 09/21/2023.  Resenting Suprep to patient's pharmacy.  New instructions will be sent.

## 2023-08-22 NOTE — Telephone Encounter (Signed)
Per Dr Tobi Bastos, patient did not pick up prep and shown up for colonoscopy today, 08/22/2023

## 2023-09-08 ENCOUNTER — Ambulatory Visit: Payer: No Typology Code available for payment source | Admitting: Pulmonary Disease

## 2023-09-21 ENCOUNTER — Encounter: Admission: RE | Payer: Self-pay | Source: Home / Self Care

## 2023-09-21 ENCOUNTER — Ambulatory Visit
Admission: RE | Admit: 2023-09-21 | Payer: No Typology Code available for payment source | Source: Home / Self Care | Admitting: Gastroenterology

## 2023-09-21 SURGERY — COLONOSCOPY WITH PROPOFOL
Anesthesia: General

## 2023-12-22 ENCOUNTER — Other Ambulatory Visit: Payer: Self-pay | Admitting: Pulmonary Disease

## 2023-12-22 DIAGNOSIS — J4489 Other specified chronic obstructive pulmonary disease: Secondary | ICD-10-CM

## 2023-12-22 MED ORDER — BREZTRI AEROSPHERE 160-9-4.8 MCG/ACT IN AERO: 2.0000 | INHALATION_SPRAY | Freq: Two times a day (BID) | RESPIRATORY_TRACT | 11 refills | Status: AC

## 2023-12-22 NOTE — Progress Notes (Signed)
 E-script requested for breztri .

## 2024-01-05 ENCOUNTER — Ambulatory Visit (INDEPENDENT_AMBULATORY_CARE_PROVIDER_SITE_OTHER): Admitting: Nurse Practitioner

## 2024-01-05 VITALS — BP 114/64 | HR 65 | Temp 98.1°F | Ht 69.0 in | Wt 175.6 lb

## 2024-01-05 DIAGNOSIS — J029 Acute pharyngitis, unspecified: Secondary | ICD-10-CM | POA: Insufficient documentation

## 2024-01-05 DIAGNOSIS — J020 Streptococcal pharyngitis: Secondary | ICD-10-CM | POA: Insufficient documentation

## 2024-01-05 DIAGNOSIS — H6502 Acute serous otitis media, left ear: Secondary | ICD-10-CM | POA: Insufficient documentation

## 2024-01-05 LAB — POCT RAPID STREP A (OFFICE): Rapid Strep A Screen: POSITIVE — AB

## 2024-01-05 MED ORDER — AMOXICILLIN-POT CLAVULANATE 875-125 MG PO TABS: 1.0000 | ORAL_TABLET | Freq: Two times a day (BID) | ORAL | 0 refills | Status: AC

## 2024-01-05 NOTE — Patient Instructions (Addendum)
 Nice to see you guys today I have sent in antibiotics to the pharmacy  We will reach out early next week to see if his hearing is improving Follow up if you do not improve Follow up as scheduled for your physical   You can use over the counter tylenol , throat lozenges, and warm salt water gargles to help with the sore throat while the antibiotics are working

## 2024-01-05 NOTE — Assessment & Plan Note (Signed)
Strep test in office. 

## 2024-01-05 NOTE — Assessment & Plan Note (Addendum)
 Strep test positive in office.  Will treat patient with Augmentin  875-125 mg twice daily for 7 days.  Patient can use over-the-counter analgesics as needed for pain along with throat lozenges and warm salt water gargles

## 2024-01-05 NOTE — Progress Notes (Signed)
 Acute Office Visit  Subjective:     Patient ID: Ronnie Palmer, male    DOB: 1951/08/22, 73 y.o.   MRN: [id number]  Chief Complaint  Patient presents with   Sore Throat    Pt complains of sore throat for a couple of weeks. Hurts to talk and throat feels very dry. Also complains of not being able to hear anything from L ear. Pt says slight pain and itchiness in R ear. Ongoing for several months and is worsening.  Pt Son requests ENT referral.      Patient is in today for sore throat with a history of PAFib, croic bronchisitis, elevated lpa, and former smoker   Sore throat has been for a couple weeks. States that it has been getting worse He is having trouble talking and described as a hoarsness. No sick contacts and noone in the house is sick. Left side is no hearing. States that it does hurt. He has not stuck anything in the ear   Review of Systems  Constitutional:  Negative for chills and fever.  HENT:  Positive for hearing loss. Negative for ear discharge, ear pain, sinus pain and sore throat.   Respiratory:  Positive for cough (at nighttime with laying down). Negative for shortness of breath.   Cardiovascular:  Negative for chest pain.  Gastrointestinal:  Negative for abdominal pain, diarrhea, nausea and vomiting.  Neurological:  Negative for dizziness and headaches.        Objective:    BP 114/64   Pulse 65   Temp 98.1 F (36.7 C) (Oral)   Ht 5\' 9"  (1.753 m)   Wt 175 lb 9.6 oz (79.7 kg)   SpO2 96%   BMI 25.93 kg/m  BP Readings from Last 3 Encounters:  01/05/24 114/64  08/08/23 131/77  08/03/23 110/62   Wt Readings from Last 3 Encounters:  01/05/24 175 lb 9.6 oz (79.7 kg)  08/08/23 172 lb 14.4 oz (78.4 kg)  08/03/23 166 lb (75.3 kg)   SpO2 Readings from Last 3 Encounters:  01/05/24 96%  08/08/23 96%  08/03/23 95%      Physical Exam Vitals and nursing note reviewed.  Constitutional:      Appearance: Normal appearance.  HENT:     Right Ear:  Tympanic membrane, ear canal and external ear normal.     Left Ear: Ear canal and external ear normal. Tympanic membrane is erythematous and bulging.     Mouth/Throat:     Mouth: Mucous membranes are moist.     Pharynx: Posterior oropharyngeal erythema present.  Cardiovascular:     Rate and Rhythm: Normal rate and regular rhythm.     Heart sounds: Normal heart sounds.  Pulmonary:     Effort: Pulmonary effort is normal.     Breath sounds: Normal breath sounds.  Lymphadenopathy:     Cervical: Cervical adenopathy present.  Neurological:     Mental Status: He is alert.     Results for orders placed or performed in visit on 01/05/24  Rapid Strep A  Result Value Ref Range   Rapid Strep A Screen Positive (A) Negative        Assessment & Plan:   Problem List Items Addressed This Visit       Respiratory   Strep throat   Strep test positive in office.  Will treat patient with Augmentin  875-125 mg twice daily for 7 days.  Patient can use over-the-counter analgesics as needed for pain along with throat lozenges and warm  salt water gargles      Relevant Medications   amoxicillin -clavulanate (AUGMENTIN ) 875-125 MG tablet     Nervous and Auditory   Non-recurrent acute serous otitis media of left ear   Will treat patient with Augmentin  875-125 twice daily for 7 days if hearing does not improve consider a course of steroids and ENT referral      Relevant Medications   amoxicillin -clavulanate (AUGMENTIN ) 875-125 MG tablet     Other   Sore throat - Primary   Strep test in office      Relevant Orders   Rapid Strep A (Completed)    Meds ordered this encounter  Medications   amoxicillin -clavulanate (AUGMENTIN ) 875-125 MG tablet    Sig: Take 1 tablet by mouth 2 (two) times daily for 7 days.    Dispense:  14 tablet    Refill:  0    Supervising Provider:   Deri Fleet A [[id number]]    Return if symptoms worsen or fail to improve, for As scheduled .  Margarie Shay, NP

## 2024-01-05 NOTE — Assessment & Plan Note (Signed)
 Will treat patient with Augmentin  875-125 twice daily for 7 days if hearing does not improve consider a course of steroids and ENT referral

## 2024-01-10 ENCOUNTER — Telehealth: Payer: Self-pay | Admitting: Nurse Practitioner

## 2024-01-10 DIAGNOSIS — H9122 Sudden idiopathic hearing loss, left ear: Secondary | ICD-10-CM

## 2024-01-10 NOTE — Telephone Encounter (Signed)
 Can we call patient/family member and see how patient's hearing and sore throat is doing with medication use

## 2024-01-10 NOTE — Telephone Encounter (Signed)
 Called and spoke to daughter on dpr she had not see patient after visit suggested that we call brother Ronnie Palmer on dpr as well. I have called left message for him to call office back.

## 2024-01-10 NOTE — Telephone Encounter (Signed)
-----   Message from [facility] sent at 01/05/2024  1:12 PM EDT ----- Regarding: Sore throat/hearing Cal and see if his sore throat is improving and if his hearing is returning

## 2024-01-11 MED ORDER — PREDNISONE 20 MG PO TABS: ORAL_TABLET | ORAL | 0 refills | Status: AC

## 2024-01-11 NOTE — Telephone Encounter (Signed)
 Contacted [employer] on dpr in regards to pt.  Tim states that both the sore throat and hearing have gotten a little better but not by much.

## 2024-01-11 NOTE — Addendum Note (Signed)
 Addended by: Dorothe Gaster on: 01/11/2024 01:54 PM   Modules accepted: Orders

## 2024-01-11 NOTE — Telephone Encounter (Signed)
 I have sent in steroids for him to take and referred to ENT

## 2024-01-11 NOTE — Telephone Encounter (Signed)
 Contacted Kelsie Patrick, pt's son on dpr.  Informed tim of steroids and ENT referral. Has no questions or concerns.

## 2024-01-20 DIAGNOSIS — H90A32 Mixed conductive and sensorineural hearing loss, unilateral, left ear with restricted hearing on the contralateral side: Secondary | ICD-10-CM | POA: Diagnosis not present

## 2024-01-20 DIAGNOSIS — H6522 Chronic serous otitis media, left ear: Secondary | ICD-10-CM | POA: Diagnosis not present

## 2024-02-09 ENCOUNTER — Ambulatory Visit
Admission: EM | Admit: 2024-02-09 | Discharge: 2024-02-09 | Disposition: A | Discharge status: Home / Self Care | Attending: Emergency Medicine | Admitting: Emergency Medicine

## 2024-02-09 DIAGNOSIS — R21 Rash and other nonspecific skin eruption: Secondary | ICD-10-CM | POA: Diagnosis not present

## 2024-02-09 MED ORDER — PREDNISONE 10 MG (21) PO TBPK: ORAL_TABLET | Freq: Every day | ORAL | 0 refills | Status: DC

## 2024-02-09 MED ORDER — METHYLPREDNISOLONE ACETATE 80 MG/ML IJ SUSP
60.0000 mg | Freq: Once | INTRAMUSCULAR | Status: AC
  Administered 2024-02-09: 60 mg via INTRAMUSCULAR

## 2024-02-09 NOTE — ED Provider Notes (Signed)
 Ronnie Palmer    CSN: [id number] Arrival date & time: 02/09/24  4098      History   Chief Complaint Chief Complaint  Patient presents with   Rash    HPI Ronnie Palmer is a 73 y.o. male.   Patient presents for evaluation of erythematous pruritic rash generalized to the body beginning 3 days ago.  For started on the bilateral legs and is rapidly spread.  Experiencing mild pain due to scratching and pruritus.  Denies drainage or fever.  Denies change in diet, toiletries, medications or recent travel.  Denies exposure to wooded or plane to the areas.  Has attempted use of Benadryl which is mildly helpful.  Not occurred prior.   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 Authorizing Provider  fluticasone  (FLONASE ) 50 MCG/ACT nasal spray Place 2 sprays into both nostrils daily. 02/23/23  Yes Dorothe Gaster, NP  predniSONE  (STERAPRED UNI-PAK 21 TAB) 10 MG (21) TBPK tablet Take by mouth daily. Take 6 tabs by mouth daily  for 1 days, then 5 tabs for 1 days, then 4 tabs for 1 days, then 3 tabs for 1  days, 2 tabs for 1 days, then 1 tab by mouth daily for 1 days 02/09/24  Yes Laurette Villescas R, NP  Acetaminophen  (TYLENOL  PO) Take 2 tablets by mouth 2 (two) times daily as needed (pain, fever, headache).    [provider]  aspirin  EC 81 MG tablet Take 1 tablet (81 mg total) by mouth daily. Swallow whole. 05/04/23   Constancia Delton, MD  budeson-glycopyrrolate-formoterol (BREZTRI  AEROSPHERE) 160-9-4.8 MCG/ACT AERO inhaler Inhale 2 puffs into the lungs in the morning and at bedtime. 12/22/23   Wilfredo Hanly, MD  clotrimazole -betamethasone  (LOTRISONE ) cream Apply 1 Application topically daily. 05/26/23   Dorothe Gaster, NP  ipratropium-albuterol  (DUONEB) 0.5-2.5 (3) MG/3ML SOLN Take 3 mLs by nebulization every 6 (six) hours as needed. 08/08/23   Wilfredo Hanly, MD    Family History History reviewed. No pertinent family history.  Social History Social History   Tobacco Use   Smoking status: Former    Current packs/day: 0.00    Average packs/day: 0.3 packs/day for 40.0 years (10.0 ttl pk-yrs)    Types: Cigarettes    Start date: 12/17/1982    Quit date: 12/17/2022    Years since quitting: 1.1   Smokeless tobacco: Never   Tobacco comments:    Has not smoked since sick  Vaping Use   Vaping status: Never Used  Substance Use Topics   Alcohol use: Not Currently    Comment:  social drinker   Drug use: Never     Allergies   Patient has no known allergies.   Review of Systems Review of Systems  Skin:  Positive for rash.     Physical Exam Triage Vital Signs ED Triage Vitals  Encounter Vitals Group     BP 02/09/24 1020 128/80     Girls Systolic BP Percentile --      Girls Diastolic BP Percentile --      Boys Systolic BP Percentile --      Boys Diastolic BP Percentile --      Pulse Rate 02/09/24 1020 77     Resp 02/09/24 1020 18     Temp 02/09/24 1020 98.2 F (36.8 C)     Temp Source 02/09/24 1020 Oral     SpO2 02/09/24 1020 97 %     Weight --      Height --       Head Circumference --      Peak Flow --      Pain Score 02/09/24 1021 1     Pain Loc --      Pain Education --      Exclude from Growth Chart --    No data found.  Updated Vital Signs BP 128/80 (BP Location: Left Arm)   Pulse 77   Temp 98.2 F (36.8 C) (Oral)   Resp 18   SpO2 97%   Visual Acuity Right Eye Distance:   Left Eye Distance:   Bilateral Distance:    Right Eye Near:   Left Eye Near:    Bilateral Near:     Physical Exam Constitutional:      Appearance: Normal appearance.   Eyes:     Extraocular Movements: Extraocular movements intact.   Pulmonary:     Effort: Pulmonary effort is normal.   Skin:    Comments: Erythematous maculopapular rash generalized to the body   Neurological:     Mental Status: He is alert and oriented to person, place, and time. Mental status is at baseline.      UC Treatments / Results  Labs (all labs ordered are listed, but only abnormal results are displayed) Labs Reviewed - No data to display  EKG   Radiology No results found.  Procedures Procedures (including critical care time)  Medications Ordered in UC Medications  methylPREDNISolone  acetate (DEPO-MEDROL ) injection 60 mg (60 mg Intramuscular Given by Other 02/09/24 1035)    Initial Impression / Assessment and Plan / UC Course  I have reviewed the triage vital signs and the nursing notes.  Pertinent labs & imaging results that were available during my care of the patient were reviewed by me and considered in my medical decision making (see chart for details).  Rash  Appears inflammatory, no signs of infection, unknown etiology, stable for outpatient management, methylprednisolone  injection given and prescribed prednisone  for home use recommended continue supportive care through antihistamines and topical medications, advised follow-up for any persisting worsening or reoccurring symptoms Final Clinical Impressions(s) / UC Diagnoses   Final diagnoses:   Rash and nonspecific skin eruption     Discharge Instructions      ????????????????????????????????????????????????????, ??????????????????????????????????????????????, ????????????????????????????????????????????.  ?????????, ?????????????????????????  ??????????????????????????????????????????????????????????????????????????????????????????????????????????????????.  ???????????????????????? prednisone  ???????????????????????????????????????????????  ???????????????????????????????? Benadryl ?????????????????? ???????? Claritin  ??? Zyrtec, Benadryl, ??????? lotion.  ???????????????????????????????? ???????????????????????????????????????????????  ????????????????????????????? ?????????????? lav daithuk pamoen soalab tum phun khonglauathi pakodkan akseb  bohu sahed nyonva bomikanpianaepng nai padchai phainok  niaemn di ven sia aetva phun cha [employer]  naiuaelani  bomi snaian khongkantidseu   MVHQIONGE sakya sa toe rony pheuosuany rudphon kan akseb lae yud khabuankan ti kirinya laeodnysapho chaloem hen kanpabpung phainai souaomng   [place] muun [place] prednisone   tam khoanaenoa kab ahan pheuosubto [id number]   soalab akan khan thanoadcha subtosai Benadryl  hedhai than nguangsum  adchasai Claritin  ru Zyrtec Benadryl  kha lamin lotion   rik liang kan soaphad kab khuaamhondon  pho ad hedhaikoed kanla khai kheuong to [employer] tidtam tha [employer]  He was evaluated for his rash which appears inflammatory, unknown cause as there has been no changes in external factors, this is okay unless rash reoccurs  At this time no signs of infection  Being given an injection of steroids to help reduce inflammation and stop the reaction process and ideally will start to see improvement within the hour  Starting tomorrow take oral prednisone  as directed with food to continue the above process  For itching you may continue use of  Benadryl is making you drowsy may use Claritin  or Zyrtec, topical Benadryl, calamine lotion  Avoid long exposure to heat as this can cause further irritation to the skin  Please follow-up if symptoms continue to persist or worsen   ED Prescriptions     Medication Sig Dispense Auth. Provider   predniSONE  (STERAPRED UNI-PAK 21 TAB) 10 MG (21) TBPK tablet Take by mouth daily. Take 6 tabs by mouth daily  for 1 days, then 5 tabs for 1 days, then 4 tabs for 1 days, then 3 tabs for 1 days, 2 tabs for 1 days, then 1 tab by mouth daily for 1 days 21 tablet Phung Kotas, Maybelle Spatz, NP      PDMP not reviewed this encounter.   Reena Canning, NP 02/09/24 1041

## 2024-02-09 NOTE — ED Triage Notes (Signed)
 Red itchy rash all over body x 3 days. Tried taking benadryl, did help with the itching but comes right back after a few hours.

## 2024-02-09 NOTE — Discharge Instructions (Signed)
????????????????????????????????????????????????????, ??????????????????????????????????????????????, ????????????????????????????????????????????.  ?????????, ?????????????????????????  ??????????????????????????????????????????????????????????????????????????????????????????????????????????????????.  ????????????????????????   prednisone  ???????????????????????????????????????????????  ???????????????????????????????? Benadryl ?????????????????? ???????? Claritin  ??? Zyrtec, Benadryl, ??????? lotion.  ???????????????????????????????? ???????????????????????????????????????????????  ????????????????????????????? ?????????????? lav daithuk pamoen soalab tum phun khonglauathi pakodkan akseb  bohu sahed nyonva bomikanpianaepng nai padchai phainok  niaemn di ven sia aetva phun cha koedkhun khunhaim   naiuaelani  bomi snaian khongkantidseu   ZOXWRUEAV sakya sa toe rony pheuosuany rudphon kan akseb lae yud khabuankan ti kirinya laeodnysapho chaloem hen kanpabpung phainai souaomng   loemton muun haikinya prednisone   tam khoanaenoa kab ahan pheuosubto [id number]   soalab akan khan thanoadcha subtosai Benadryl  hedhai than nguangsum  adchasai Claritin  ru Zyrtec Benadryl  kha lamin lotion   rik liang kan soaphad kab khuaamhondon  pho ad hedhaikoed kanla khai kheuong to [employer] tidtam tha [employer]  He was evaluated for his rash which appears inflammatory, unknown cause as there has been no changes in external factors, this is okay unless rash reoccurs  At this time no signs of infection  Being given an injection of steroids to help reduce inflammation and stop the reaction process and ideally will start to see improvement within the hour  Starting tomorrow take oral prednisone  as directed with food to continue the above process  For itching you may continue use of Benadryl is making you drowsy may use Claritin  or Zyrtec, topical Benadryl,  calamine lotion  Avoid long exposure to heat as this can cause further irritation to the skin  Please follow-up if symptoms continue to persist or worsen

## 2024-03-19 ENCOUNTER — Ambulatory Visit (INDEPENDENT_AMBULATORY_CARE_PROVIDER_SITE_OTHER)

## 2024-03-19 ENCOUNTER — Ambulatory Visit
Admission: EM | Admit: 2024-03-19 | Discharge: 2024-03-19 | Disposition: A | Discharge status: Home / Self Care | Attending: Physician Assistant | Admitting: Physician Assistant

## 2024-03-19 DIAGNOSIS — J209 Acute bronchitis, unspecified: Secondary | ICD-10-CM

## 2024-03-19 HISTORY — DX: Chronic obstructive pulmonary disease, unspecified: J44.9

## 2024-03-19 HISTORY — DX: Unspecified asthma, uncomplicated: J45.909

## 2024-03-19 MED ORDER — PREDNISONE 50 MG PO TABS: ORAL_TABLET | ORAL | 0 refills | Status: DC

## 2024-03-19 MED ORDER — DOXYCYCLINE HYCLATE 100 MG PO CAPS: 100.0000 mg | ORAL_CAPSULE | Freq: Two times a day (BID) | ORAL | 0 refills | Status: AC

## 2024-03-19 NOTE — ED Triage Notes (Signed)
 Patient to Urgent Care with complaints of cough/ shortness of breath/ nasal congestion.   Symptoms started one week ago.  Hx of COPD/ asthma. Using prescription inhaler.

## 2024-03-19 NOTE — Discharge Instructions (Addendum)
See your Physician for recheck in 2-3 days.  Return if any problems. °

## 2024-03-19 NOTE — ED Provider Notes (Signed)
 Ronnie Palmer    CSN: [id number] Arrival date & time: 03/19/24  1404      History   Chief Complaint Chief Complaint  Patient presents with   Cough   Shortness of Breath    HPI Ronnie Palmer is a 73 y.o. male.   Patient complains of a cough and congestion.  Patient is here with a family member who reports patient has had a cough for over a week that is getting worse.  Patient has a past medical history of COPD bronchitis sinusitis.  Patient is a former smoker.  Patient is on an inhaler and DuoNebs at home.  The history is provided by the patient and a relative. No language interpreter was used.  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 Authorizing Provider  doxycycline  (VIBRAMYCIN ) 100 MG capsule Take 1 capsule (100 mg total) by mouth 2 (two) times daily. 03/19/24  Yes Letitia Sabala K, PA-C  predniSONE  (DELTASONE ) 50 MG  tablet One tablet a day  for 5 days 03/19/24  Yes Hawkin Charo K, PA-C  Acetaminophen  (TYLENOL  PO) Take 2 tablets by mouth 2 (two) times daily as needed (pain, fever, headache).    [provider]  aspirin  EC 81 MG tablet Take 1 tablet (81 mg total) by mouth daily. Swallow whole. 05/04/23   Darliss Rogue, MD  budeson-glycopyrrolate-formoterol (BREZTRI  AEROSPHERE) 160-9-4.8 MCG/ACT AERO inhaler Inhale 2 puffs into the lungs in the morning and at bedtime. 12/22/23   Kara Dorn NOVAK, MD  clotrimazole -betamethasone  (LOTRISONE ) cream Apply 1 Application topically daily. 05/26/23   Wendee Lynwood HERO, NP  fluticasone  (FLONASE ) 50 MCG/ACT nasal spray Place 2 sprays into both nostrils daily. 02/23/23   Wendee Lynwood HERO, NP  ipratropium-albuterol  (DUONEB) 0.5-2.5 (3) MG/3ML SOLN Take 3 mLs by nebulization every 6 (six) hours as needed. Patient not taking: Reported on 03/19/2024 08/08/23   Kara Dorn NOVAK, MD    Family History History reviewed. No pertinent family history.  Social History Social History   Tobacco Use   Smoking status: Former    Current packs/day: 0.00    Average packs/day: 0.3 packs/day for 40.0 years (10.0 ttl pk-yrs)    Types: Cigarettes    Start date: 12/17/1982    Quit date: 12/17/2022    Years since quitting: 1.2   Smokeless tobacco: Never   Tobacco comments:    Has not smoked since sick  Vaping  Use   Vaping status: Never Used  Substance Use Topics   Alcohol use: Not Currently    Comment: social drinker   Drug use: Never     Allergies   Patient has no known allergies.   Review of Systems Review of Systems  Respiratory:  Positive for cough and shortness of breath.   All other systems reviewed and are negative.    Physical Exam Triage Vital Signs ED Triage Vitals  Encounter Vitals Group     BP 03/19/24 1613 (!) 143/82     Girls Systolic BP Percentile --      Girls Diastolic BP Percentile --      Boys Systolic BP Percentile --      Boys Diastolic  BP Percentile --      Pulse Rate 03/19/24 1613 72     Resp 03/19/24 1613 (!) 24     Temp 03/19/24 1613 98.7 F (37.1 C)     Temp src --      SpO2 03/19/24 1613 92 %     Weight --      Height --      Head Circumference --      Peak Flow --      Pain Score 03/19/24 1623 5     Pain Loc --      Pain Education --      Exclude from Growth Chart --    No data found.  Updated Vital Signs BP (!) 143/82   Pulse 72   Temp 98.7 F (37.1 C)   Resp (!) 24   SpO2 92%   Visual Acuity Right Eye Distance:   Left Eye Distance:   Bilateral Distance:    Right Eye Near:   Left Eye Near:    Bilateral Near:     Physical Exam Vitals and nursing note reviewed.  Constitutional:      Appearance: He is well-developed.  HENT:     Head: Normocephalic.  Cardiovascular:     Rate and Rhythm: Normal rate and regular rhythm.  Pulmonary:     Effort: Pulmonary effort is normal.     Breath sounds: Examination of the right-upper field reveals rhonchi. Examination of the left-upper field reveals rhonchi. Examination of the right-middle field reveals rhonchi. Examination of the left-middle field reveals rhonchi. Examination of the right-lower field reveals rhonchi. Examination of the left-lower field reveals rhonchi. Rhonchi present.  Abdominal:     General: There is no distension.  Musculoskeletal:        General: Normal range of motion.     Cervical back: Normal range of motion.  Skin:    General: Skin is warm.  Neurological:     General: No focal deficit present.     Mental Status: He is alert and oriented to person, place, and time.  Psychiatric:        Mood and Affect: Mood normal.      UC Treatments / Results  Labs (all labs ordered are listed, but only abnormal results are displayed) Labs Reviewed - No data to display  EKG   Radiology DG Chest 2 View Result Date: 03/19/2024 CLINICAL DATA:  Cough EXAM: CHEST - 2 VIEW COMPARISON:  02/03/2023 FINDINGS: Central airways thickening. No  focal consolidation, pleural effusion or pneumothorax. Normal cardiomediastinal silhouette. IMPRESSION: Central airways thickening, possible bronchitis. No focal airspace opacity. Electronically Signed   By: Luke Bun M.D.   On: 03/19/2024 17:08    Procedures Procedures (including critical care time)  Medications  Ordered in UC Medications - No data to display  Initial Impression / Assessment and Plan / UC Course  I have reviewed the triage vital signs and the nursing notes.  Pertinent labs & imaging results that were available during my care of the patient were reviewed by me and considered in my medical decision making (see chart for details).    Chest x-ray shows no evidence of pneumonia.  Patient counseled on results.  Patient is advised to continue inhalers.  He is given a prescription for prednisone  and doxycycline .  I advised follow-up with primary care physician for recheck in 2 to 3 days.  Return if symptoms worsen or change  Final Clinical Impressions(s) / UC Diagnoses   Final diagnoses:  Acute bronchitis, unspecified organism     Discharge Instructions      See your Physician for recheck in 2-3 days.  Return if any problems.    ED Prescriptions     Medication Sig Dispense Auth. Provider   doxycycline  (VIBRAMYCIN ) 100 MG capsule Take 1 capsule (100 mg total) by mouth 2 (two) times daily. 20 capsule Nickalus Thornsberry K, PA-C   predniSONE  (DELTASONE ) 50 MG tablet One tablet a day  for 5 days 5 tablet Flint Sonny POUR, PA-C      PDMP not reviewed this encounter. An After Visit Summary was printed and given to the patient.       Flint Sonny POUR, PA-C 03/19/24 1820

## 2024-04-09 ENCOUNTER — Encounter: Payer: Self-pay | Admitting: Pulmonary Disease

## 2024-05-28 ENCOUNTER — Encounter: Payer: No Typology Code available for payment source | Admitting: Nurse Practitioner

## 2024-05-28 NOTE — Progress Notes (Deleted)
   Established Patient Office Visit  Subjective   Patient ID: Ronnie Palmer, male    DOB: [birth year]  Age: 73 y.o. MRN: [id number]  No chief complaint on file.   HPI  Asthma COPD overlap syndrome: Patient currently maintained on Breztri  2 puffs twice daily followed by Dorn Chill, MD  A-fib: History of paroxysmal atrial fibrillation seen by cardiology on 07/21/2023 was referred to EP on 08/03/2023.  Discussed follow-up in 6 months   for complete physical and follow up of chronic conditions.  Immunizations: -Tetanus: Completed in? -Influenza: 05/15/2024 -Shingles:? -Pneumonia: Completed 2024 -COVID: Original series and booster  Diet: Fair diet.  Exercise: No regular exercise.  Eye exam: Completes annually  Dental exam: Completes semi-annually    Colonoscopy: Completed in 06/07/2023 with a positive Cologuard.  Patient was referred and scheduled but did not pick up the prep.  He was rescheduled to 09/21/2023.  Do not see that was completed Lung Cancer Screening: Completed in   PSA: Due  Advance directive:     {History (Optional):23778}  ROS    Objective:     There were no vitals taken for this visit. {Vitals History (Optional):23777}  Physical Exam   No results found for any visits on 05/28/24.  {Labs (Optional):23779}  The 10-year ASCVD risk score (Arnett DK, et al., 2019) is: 21.6%    Assessment & Plan:   Problem List Items Addressed This Visit   None   No follow-ups on file.    Adina Crandall, NP

## 2024-06-04 ENCOUNTER — Telehealth: Payer: Self-pay

## 2024-06-04 NOTE — Telephone Encounter (Signed)
 [employer], paperwork approved.

## 2024-06-25 ENCOUNTER — Other Ambulatory Visit: Payer: Self-pay | Admitting: Nurse Practitioner

## 2024-06-25 ENCOUNTER — Ambulatory Visit (INDEPENDENT_AMBULATORY_CARE_PROVIDER_SITE_OTHER): Admitting: Nurse Practitioner

## 2024-06-25 ENCOUNTER — Ambulatory Visit (INDEPENDENT_AMBULATORY_CARE_PROVIDER_SITE_OTHER)
Admission: RE | Admit: 2024-06-25 | Discharge: 2024-06-25 | Disposition: A | Discharge status: Home / Self Care | Source: Ambulatory Visit | Attending: Nurse Practitioner | Admitting: Nurse Practitioner

## 2024-06-25 VITALS — BP 112/60 | HR 54 | Temp 97.7°F | Ht 69.0 in | Wt 161.6 lb

## 2024-06-25 DIAGNOSIS — M546 Pain in thoracic spine: Secondary | ICD-10-CM

## 2024-06-25 DIAGNOSIS — R0789 Other chest pain: Secondary | ICD-10-CM

## 2024-06-25 MED ORDER — CLOTRIMAZOLE-BETAMETHASONE 1-0.05 % EX CREA: 1.0000 | TOPICAL_CREAM | Freq: Every day | CUTANEOUS | 0 refills | Status: AC

## 2024-06-25 MED ORDER — MELOXICAM 15 MG PO TABS: 15.0000 mg | ORAL_TABLET | Freq: Every day | ORAL | 0 refills | Status: AC

## 2024-06-25 MED ORDER — TIZANIDINE HCL 2 MG PO TABS: 2.0000 mg | ORAL_TABLET | Freq: Two times a day (BID) | ORAL | 0 refills | Status: AC | PRN

## 2024-06-25 NOTE — Progress Notes (Signed)
 Established Patient Office Visit  Subjective   Patient ID: Ronnie Palmer, male    DOB: 04-Jan-1951  Age: 73 y.o. MRN: [id number]  Chief Complaint  Patient presents with   Follow-up    Pt complains of ongoing back pain towards the neck area for the past couple of weeks.    Medication Refill    Clotrimazole      Discussed the use of AI scribe software for clinical note transcription with the patient, who gave verbal consent to proceed.  History of Present Illness Ronnie Palmer is a 73 year old male who presents with back and rib pain.  He has been experiencing back pain for approximately two weeks, localized to the left side and radiating to the shoulder. He describes the pain as sharp, with numbness and occasional weakness in the arm. The numbness is intermittent. There is no history of recent neck injury or surgery.  He reports rib pain that began about two weeks ago after reaching into a trash can, during which he heard a 'pop'. The pain started a couple of days later and has been constant since then, described as a dull, aching sensation similar to muscle pain. He reports that the pain is constant but bearable. No chest pain or shortness of breath, although he mentioned some shortness of breath earlier in the conversation.  For pain management, he has been taking ibuprofen  600 mg once a day and is concerned about over-reliance. He has also used ICY hot patches with some relief and alternates between Tylenol  and ibuprofen . He is currently using a Breztri  inhaler and applies clotrimazole  cream to his ear as needed.     Review of Systems  Constitutional:  Negative for chills and fever.  Respiratory:  Positive for shortness of breath.   Cardiovascular:  Positive for chest pain.  Musculoskeletal:  Positive for joint pain and myalgias.  Neurological:  Positive for tingling. Negative for weakness and headaches.      Objective:     BP 112/60   Pulse (!) 54   Temp  97.7 F (36.5 C) (Oral)   Ht 5' 9 (1.753 m)   Wt 161 lb 9.6 oz (73.3 kg)   SpO2 94%   BMI 23.86 kg/m    Physical Exam Vitals and nursing note reviewed.  Constitutional:      Appearance: Normal appearance.  Cardiovascular:     Rate and Rhythm: Normal rate and regular rhythm.     Heart sounds: Normal heart sounds.  Pulmonary:     Effort: Pulmonary effort is normal.     Breath sounds: Normal breath sounds.  Chest:     Chest wall: Tenderness present.  Abdominal:     General: Bowel sounds are normal. There is no distension.     Tenderness: There is no abdominal tenderness.  Musculoskeletal:       Arms:  Neurological:     General: No focal deficit present.     Mental Status: He is alert.     Deep Tendon Reflexes:     Reflex Scores:      Bicep reflexes are 2+ on the right side and 2+ on the left side.    Comments: Bilateral upper extremity strength 5/5 Radial pulses 2+ bilaterally       No results found for any visits on 06/25/24.    The 10-year ASCVD risk score (Arnett DK, et al., 2019) is: 15.7%    Assessment & Plan:   Problem List Items Addressed This Visit  None Visit Diagnoses       Acute left-sided thoracic back pain    -  Primary   Relevant Medications   meloxicam (MOBIC) 15 MG tablet   tiZANidine (ZANAFLEX) 2 MG tablet     Right-sided chest wall pain       Relevant Medications   meloxicam (MOBIC) 15 MG tablet   Other Relevant Orders   DG Ribs Unilateral W/Chest Left      Assessment and Plan Assessment & Plan Muscle strain of chest wall Muscle strain likely from reaching injury. Constant pain with shortness of breath. Rib fracture possible. - Order chest X-ray to rule out rib fracture and assess lung condition. - Prescribe meloxicam daily with food for 5-7 days. - Advise to avoid ibuprofen , Advil , Aleve, naproxen, and BC Goodie. - Allow use of Tylenol  for additional pain relief. - Prescribe muscle relaxer at bedtime for a few days.  Back  pain with intermittent numbness and weakness of arm Back pain with intermittent numbness and weakness suggests nerve involvement or muscle strain. Pain sharp, worsens with arm movement.  Return in about 4 weeks (around 07/23/2024) for CPE and Labs.    Adina Crandall, NP

## 2024-06-25 NOTE — Patient Instructions (Signed)
 Nice to see you today  I have sent in some pain medication that I want him to take every day for the next 5-7 days with food. Then he can use it as needed thereafter. Avoid NSAIDS like Ibuprofen , Motrin , Aleve, Naproxen, BC/Goody powders. Tylenol  is ok to use if needed  I have also sent in a muscle relaxer. This can cause dizziness and sleepiness so use caution  Follow up with me in the next month for physical and labs

## 2024-06-27 ENCOUNTER — Ambulatory Visit: Payer: Self-pay | Admitting: Nurse Practitioner

## 2024-10-11 ENCOUNTER — Ambulatory Visit: Admitting: Cardiology
